# Patient Record
Sex: Female | Born: 1975 | Race: Black or African American | Hispanic: No | Marital: Married | Smoking: Current every day smoker
Health system: Southern US, Community
[De-identification: ages and names within clinical notes are randomized; demographics above are authoritative.]

## PROBLEM LIST (undated history)

## (undated) DIAGNOSIS — F32A Depression, unspecified: Secondary | ICD-10-CM

## (undated) DIAGNOSIS — Z5189 Encounter for other specified aftercare: Secondary | ICD-10-CM

## (undated) DIAGNOSIS — D649 Anemia, unspecified: Secondary | ICD-10-CM

## (undated) DIAGNOSIS — R011 Cardiac murmur, unspecified: Secondary | ICD-10-CM

## (undated) HISTORY — DX: Encounter for other specified aftercare: Z51.89

## (undated) HISTORY — PX: CHOLECYSTECTOMY: SHX55

## (undated) HISTORY — DX: Depression, unspecified: F32.A

## (undated) HISTORY — PX: TUBAL LIGATION: SHX77

## (undated) HISTORY — DX: Cardiac murmur, unspecified: R01.1

---

## 2021-01-24 ENCOUNTER — Emergency Department

## 2021-01-24 ENCOUNTER — Other Ambulatory Visit: Payer: Self-pay

## 2021-01-24 ENCOUNTER — Emergency Department
Admission: EM | Admit: 2021-01-24 | Discharge: 2021-01-24 | Disposition: A | Discharge status: Home / Self Care | Attending: Emergency Medicine | Admitting: Emergency Medicine

## 2021-01-24 DIAGNOSIS — R110 Nausea: Secondary | ICD-10-CM | POA: Insufficient documentation

## 2021-01-24 DIAGNOSIS — R1011 Right upper quadrant pain: Secondary | ICD-10-CM | POA: Insufficient documentation

## 2021-01-24 DIAGNOSIS — R1010 Upper abdominal pain, unspecified: Secondary | ICD-10-CM

## 2021-01-24 LAB — URINALYSIS, COMPLETE (UACMP) WITH MICROSCOPIC
Bacteria, UA: NONE SEEN
Bilirubin Urine: NEGATIVE
Glucose, UA: NEGATIVE mg/dL
Ketones, ur: NEGATIVE mg/dL
Leukocytes,Ua: NEGATIVE
Nitrite: NEGATIVE
Protein, ur: NEGATIVE mg/dL
Specific Gravity, Urine: 1.014 (ref 1.005–1.030)
pH: 6 (ref 5.0–8.0)

## 2021-01-24 LAB — COMPREHENSIVE METABOLIC PANEL
ALT: 10 U/L (ref 0–44)
AST: 14 U/L — ABNORMAL LOW (ref 15–41)
Albumin: 4.1 g/dL (ref 3.5–5.0)
Alkaline Phosphatase: 53 U/L (ref 38–126)
Anion gap: 7 (ref 5–15)
BUN: 7 mg/dL (ref 6–20)
CO2: 26 mmol/L (ref 22–32)
Calcium: 9.1 mg/dL (ref 8.9–10.3)
Chloride: 105 mmol/L (ref 98–111)
Creatinine, Ser: 0.61 mg/dL (ref 0.44–1.00)
GFR, Estimated: >60 mL/min (ref >60)
Glucose, Bld: 90 mg/dL (ref 70–99)
Potassium: 3.9 mmol/L (ref 3.5–5.1)
Sodium: 138 mmol/L (ref 135–145)
Total Bilirubin: 0.9 mg/dL (ref 0.3–1.2)
Total Protein: 7.3 g/dL (ref 6.5–8.1)

## 2021-01-24 LAB — CBC
HCT: 39.3 % (ref 36.0–46.0)
Hemoglobin: 13.3 g/dL (ref 12.0–15.0)
MCH: 30.3 pg (ref 26.0–34.0)
MCHC: 33.8 g/dL (ref 30.0–36.0)
MCV: 89.5 fL (ref 80.0–100.0)
Platelets: 138 10*3/uL — ABNORMAL LOW (ref 150–400)
RBC: 4.39 MIL/uL (ref 3.87–5.11)
RDW: 14.5 % (ref 11.5–15.5)
WBC: 9.9 10*3/uL (ref 4.0–10.5)
nRBC: 0 % (ref 0.0–0.2)

## 2021-01-24 LAB — POC URINE PREG, ED: Preg Test, Ur: NEGATIVE

## 2021-01-24 LAB — LIPASE, BLOOD: Lipase: 25 U/L (ref 11–51)

## 2021-01-24 MED ORDER — ACETAMINOPHEN 500 MG PO TABS
1000.0000 mg | ORAL_TABLET | Freq: Once | ORAL | Status: DC
  Filled 2021-01-24: qty 2

## 2021-01-24 MED ORDER — KETOROLAC TROMETHAMINE 30 MG/ML IJ SOLN
30.0000 mg | Freq: Once | INTRAMUSCULAR | Status: AC
  Administered 2021-01-24: 30 mg via INTRAMUSCULAR
  Filled 2021-01-24: qty 1

## 2021-01-24 MED ORDER — IBUPROFEN 400 MG PO TABS
600.0000 mg | ORAL_TABLET | Freq: Once | ORAL | Status: DC
  Filled 2021-01-24: qty 2

## 2021-01-24 NOTE — Discharge Instructions (Signed)
Please take Tylenol and ibuprofen/Advil for your pain.  It is safe to take them together, or to alternate them every few hours.  Take up to 1000mg  of Tylenol at a time, up to 4 times per day.  Do not take more than 4000 mg of Tylenol in 24 hours.  For ibuprofen, take 400-600 mg, 4-5 times per day.  I have attached the information for both general surgery and OB/GYN for you to follow-up with in the clinic.  If you develop any worsening symptoms despite these medications, please return to the ED.

## 2021-01-24 NOTE — ED Notes (Signed)
Pt c/o lower pelvic pain. Has tubes tied so MD wanted pt to come rule out ectopic pregnancy.

## 2021-01-24 NOTE — ED Provider Notes (Signed)
[facility] Emergency Department Provider Note   ____________________________________________    I have reviewed the triage vital signs and the nursing notes.   HISTORY  Chief Complaint Abdominal Pain     HPI Gina Jennings is a 45 y.o. female who presents with complaints of right flank pain for approximately 2 weeks.  Patient reports that she has had waxing and waning pain in her right flank, more in the back and in the flank.  Occasionally it radiates to her groin.  She denies dysuria.  No fevers or chills, occasional nausea.  Has not take anything for this.  No history of abdominal surgery.  Has not noticed any hematuria  History reviewed. No pertinent past medical history.  There are no problems to display for this patient.   History reviewed. No pertinent surgical history.  Prior to Admission medications   Not on File     Allergies Patient has no known allergies.  No family history on file.  Social History    Review of Systems  Constitutional: No fever/chills Eyes: No visual changes.  ENT: No sore throat. Cardiovascular: Denies chest pain. Respiratory: Denies shortness of breath. Gastrointestinal: As above Genitourinary: Negative for dysuria.  No hematuria Musculoskeletal: Negative for back pain. Skin: Negative for rash. Neurological: Negative for headache   ____________________________________________   PHYSICAL EXAM:  VITAL SIGNS: ED Triage Vitals  Enc Vitals Group     BP 01/24/21 0925 119/75     Pulse Rate 01/24/21 0925 75     Resp 01/24/21 0925 16     Temp 01/24/21 0925 98.4 F (36.9 C)     Temp Source 01/24/21 0925 Oral     SpO2 01/24/21 0925 100 %     Weight 01/24/21 0926 61.2 kg (135 lb)     Height 01/24/21 0926 1.575 m (5\' 2" )     Head Circumference --      Peak Flow --      Pain Score 01/24/21 0928 4     Pain Loc --      Pain Edu? --      Excl. in GC? --     Constitutional: Alert and  oriented  Nose: No congestion/rhinnorhea. Mouth/Throat: Mucous membranes are moist.    Cardiovascular: Normal rate, regular rhythm. Grossly normal heart sounds.  Good peripheral circulation. Respiratory: Normal respiratory effort.  No retractions. Lungs CTAB. Gastrointestinal: Soft, mild right upper quadrant tenderness palpation, no distention  Musculoskeletal: Warm and well perfused Neurologic:  Normal speech and language. No gross focal neurologic deficits are appreciated.  Skin:  Skin is warm, dry and intact. No rash noted. Psychiatric: Mood and affect are normal. Speech and behavior are normal.  ____________________________________________   LABS (all labs ordered are listed, but only abnormal results are displayed)  Labs Reviewed  COMPREHENSIVE METABOLIC PANEL - Abnormal; Notable for the following components:      Result Value   AST 14 (*)    All other components within normal limits  CBC - Abnormal; Notable for the following components:   Platelets 138 (*)    All other components within normal limits  URINALYSIS, COMPLETE (UACMP) WITH MICROSCOPIC - Abnormal; Notable for the following components:   Color, Urine YELLOW (*)    APPearance HAZY (*)    Hgb urine dipstick MODERATE (*)    All other components within normal limits  LIPASE, BLOOD  POC URINE PREG, ED   ____________________________________________  EKG  None ____________________________________________  RADIOLOGY  CT renal  study reviewed by me Ultrasound reviewed by me ____________________________________________   PROCEDURES  Procedure(s) performed: No  Procedures   Critical Care performed: No ____________________________________________   INITIAL IMPRESSION / ASSESSMENT AND PLAN / ED COURSE  Pertinent labs & imaging results that were available during my care of the patient were reviewed by me and considered in my medical decision making (see chart for details).  Patient presents with right  flank pain, she describes more pain in the back and the flank, she does have hemoglobin in her urine raising suspicion for ureterolithiasis, differential also includes cholelithiasis  Her pain is under control at this time, will send for CT renal stone study  CT negative for ureterolithiasis, right ovarian cyst unlikely to be causing her pain given location of her pain more of the right mid back.  Abnormal appearance of the gallbladder on CT, will send for ultrasound  I have asked my colleague to follow-up on ultrasound results    ____________________________________________   FINAL CLINICAL IMPRESSION(S) / ED DIAGNOSES  Final diagnoses:  Upper abdominal pain        Note:  This document was prepared using Dragon voice recognition software and may include unintentional dictation errors.   Jene Every, MD 01/24/21 [phone]

## 2021-01-24 NOTE — ED Triage Notes (Signed)
Pt c/o RLQ pain that radiates into the leg and around into the back with nausea for the past 2 weeks.

## 2021-01-24 NOTE — ED Notes (Signed)
Pt asked to step outside for cigarette. Informed that if she leaves she will have to check back in. Pt offered nicotine patch but declined.

## 2021-01-24 NOTE — ED Notes (Signed)
Pt requesting discharge. Dr. Katrinka Blazing at bedside

## 2021-01-24 NOTE — ED Notes (Signed)
Pt states that she cannot keep the pills down without food. Will wait for report from surgeon that pt can eat before taking pills.

## 2021-01-24 NOTE — ED Notes (Signed)
Taken to CT.

## 2021-01-24 NOTE — ED Notes (Signed)
Secretary called to notified nuc med on call tech

## 2021-01-24 NOTE — ED Provider Notes (Signed)
Patient received in signout from Dr. Cyril Loosen pending RUQ u/s to evaluate for acute cystitis due to a CT renal study demonstrating an enlarged gallbladder with stones.  RUQ ultrasound reviewed by me with stones and sludge, borderline wall thickening, but no sonographic Murphy's sign.  I reevaluate the patient and she has some tenderness to her RUQ, RLQ and really diffusely and mildly throughout her right-sided abdomen.  I discussed the case with Dr. Maia Plan, who recommends HIDA scan, but apparently we do not have nuclear medicine coverage overnight this week due to staffing shortages.  I have paged Dr. Maia Plan to discuss the case with him again, but prior to this callback patient is requesting and demanding discharge.  She looks well to me and has minimal abdominal tenderness without peritoneal features throughout her right-sided abdomen.  We again discussed the possibility of ovarian pathology versus biliary pathology causing her symptoms.  She requests referrals to outpatient OB/GYN and surgery as well as discharge.  She is capacity make this decision and we discussed return precautions for the ED.  Patient discharged in no acute distress.   Clinical Course as of 01/24/21 1924  Tue Jan 24, 2021  1518 I speak with Dr Maia Plan, who will evaluate the patient [DS]  1637 I speak with Dr Maia Plan, he has evaluated pt and orders HIDA scan. He recommends that if this scan is negative for cholecystitis, he would be comfortable with discharging the patient and her following up as an outpatient with surgery for eventual cholecystectomy as an outpatient. [DS]  1845 Nurses and unit secretary inform me that HIDA scan is unavailable during the evenings this week due to staffing shortages.  I was unaware of this and I paged Dr. Maia Plan to discuss the case with him. [DS]  1921 Called to the bedside because patient is requesting discharge.  I reevaluate the patient and she reports that she is hungry and wants to go home.  She  is requesting outpatient referrals to surgery and OB/GYN and she is refusing additional analgesia. [DS]    Clinical Course User Index [DS] Delton Prairie, MD      Delton Prairie, MD 01/24/21 [phone]

## 2021-01-24 NOTE — ED Notes (Signed)
Surgery at bedside.

## 2021-01-25 ENCOUNTER — Ambulatory Visit: Payer: Self-pay

## 2021-02-02 ENCOUNTER — Ambulatory Visit: Payer: Self-pay | Admitting: General Surgery

## 2021-02-03 ENCOUNTER — Ambulatory Visit: Payer: Self-pay | Admitting: General Surgery

## 2021-02-03 ENCOUNTER — Other Ambulatory Visit: Payer: Self-pay

## 2021-02-03 ENCOUNTER — Other Ambulatory Visit
Admission: RE | Admit: 2021-02-03 | Discharge: 2021-02-03 | Disposition: A | Discharge status: Home / Self Care | Source: Ambulatory Visit | Attending: General Surgery | Admitting: General Surgery

## 2021-02-03 DIAGNOSIS — Z01812 Encounter for preprocedural laboratory examination: Secondary | ICD-10-CM | POA: Diagnosis not present

## 2021-02-03 DIAGNOSIS — Z20822 Contact with and (suspected) exposure to covid-19: Secondary | ICD-10-CM | POA: Insufficient documentation

## 2021-02-03 HISTORY — DX: Anemia, unspecified: D64.9

## 2021-02-03 LAB — SARS CORONAVIRUS 2 (TAT 6-24 HRS): SARS Coronavirus 2: NEGATIVE

## 2021-02-03 NOTE — H&P (Signed)
PATIENT PROFILE: Gina Jennings is a 45 y.o. female who presents to the Clinic for consultation at the request of Dr. Room for evaluation of cholelithiasis.  PCP:  Gina Boroughs, MD  HISTORY OF PRESENT ILLNESS: Ms. Gina Jennings reports she went to the emergency room for evaluation of right-sided abdominal pain.  Initially she reports that the abdominal pain was lower in the abdomen but the last week he has been in the right upper quadrant.  The pain radiates to her right shoulder.  The pain exacerbated by oral intake of greasy food.  There has been no alleviating factors.  Pain is intermittent.  Patient denies fever or chills.  At the ED she was found without leukocytosis.  Ultrasound showed gallstones and borderline gallbladder wall thickening.  I personally evaluated the images of the abdominal ultrasound.  She also had a CT scan of the abdomen and pelvis that shows a complex cyst of the right ovary.  The patient reported that she has been evaluated by gynecology for this cyst and they reported that there is no need of excision of the cyst.   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   Urge incontinence 03/12/2012   Anemia 03/12/2012   Depression 03/12/2012   Vitamin D deficiency 03/12/2012      GENERAL REVIEW OF SYSTEMS:   General ROS: negative for - chills, fatigue, fever, weight gain or weight loss Allergy and Immunology ROS: negative for - hives  Hematological and Lymphatic ROS: negative for - bleeding problems or bruising, negative for palpable nodes Endocrine ROS: negative for - heat or cold intolerance, hair changes Respiratory ROS: negative for - cough, shortness of breath or wheezing Cardiovascular ROS: no chest pain or palpitations GI ROS: negative for nausea, vomiting, diarrhea, constipation.  Positive abdominal pain Musculoskeletal ROS: negative for - joint swelling or muscle  pain Neurological ROS: negative for - confusion, syncope Dermatological ROS: negative for pruritus and rash Psychiatric: negative for anxiety, depression, difficulty sleeping and memory loss  MEDICATIONS: Current Outpatient Medications  Medication Sig Dispense Refill  . ergocalciferol, vitamin D2, 1,250 mcg (50,000 unit) capsule Take 1 capsule (50,000 Units total) by mouth every 7 (seven) days 12 capsule 3  . valACYclovir (VALTREX) 500 MG tablet Take 1 tablet (500 mg total) by mouth once daily as needed 90 tablet 3  . norethindrone-ethinyl estradiol (BLISOVI FE 1/20, 28,) 1 mg-20 mcg (21)/75 mg (7) tablet Take 1 tablet by mouth once daily for 90 days 84 tablet 3   No current facility-administered medications for this visit.    ALLERGIES: Patient has no known allergies.  PAST MEDICAL HISTORY: Past Medical History:  Diagnosis Date  . Anemia 03/12/2012  . Depression 03/12/2012  . Encounter for blood transfusion 09/2006  . 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 Sleep apnea   . Smoker 03/12/2012  . Urge incontinence 03/12/2012    PAST SURGICAL HISTORY: Past Surgical History:  Procedure Laterality Date  . CESAREAN SECTION  2011  . WISDOM TOOTH EXTRACTION       FAMILY HISTORY: Family History  Problem Relation Age of Onset  . Hepatitis Mother   . Cirrhosis Mother   . Hepatitis C Mother   . Stroke Father   . No Known Problems Sister      SOCIAL HISTORY: Social History   Socioeconomic History  . Marital status: Married  Spouse name: Not on file  . Number of children: Not on file  . Years of education: Not on file  . Highest education level: Not on file  Occupational History  . Not on file  Tobacco Use  . Smoking status: Current Every Day Smoker    Packs/day: 0.50    Years: 18.00    Pack years: 9.00    Types: Cigarettes    Last attempt to quit: 02/16/2014    Years since quitting: 6.9  . Smokeless tobacco: Never Used  Substance and Sexual Activity  .  Alcohol use: Yes    Comment: socially  . Drug use: No  . Sexual activity: Yes    Partners: Female, Female    Birth control/protection: Surgical  Other Topics Concern  . Would you please tell us about the people who live in your home, your pets, or anything else important to your social life? Yes  Social History Narrative  . Not on file   Social Determinants of Health   Financial Resource Strain: Not on file  Food Insecurity: Not on file  Transportation Needs: Not on file    PHYSICAL EXAM: Vitals:   02/02/21 1330  BP: 109/63  Pulse: 71   Body mass index is 24.87 kg/m. Weight: 61.7 kg (136 lb)   GENERAL: Alert, active, oriented x3  HEENT: Pupils equal reactive to light. Extraocular movements are intact. Sclera clear. Palpebral conjunctiva normal red color.Pharynx clear.  NECK: Supple with no palpable mass and no adenopathy.  LUNGS: Sound clear with no rales rhonchi or wheezes.  HEART: Regular rhythm S1 and S2 without murmur.  ABDOMEN: Soft and depressible, nontender with no palpable mass, no hepatomegaly.   EXTREMITIES: Well-developed well-nourished symmetrical with no dependent edema.  NEUROLOGICAL: Awake alert oriented, facial expression symmetrical, moving all extremities.  REVIEW OF DATA: I have reviewed the following data today: No visits with results within 3 Month(s) from this visit.  Latest known visit with results is:  Office Visit on 06/02/2020  Component Date Value  . Vitamin D Total, 25OH 06/02/2020 30   . WBC (White Blood Cell Co* 06/02/2020 8.2   . Hemoglobin 06/02/2020 13.3   . Hematocrit 06/02/2020 41.2   . Platelets 06/02/2020 191   . MCV (Mean Corpuscular Vo* 06/02/2020 91   . MCH (Mean Corpuscular He* 06/02/2020 29.4   . MCHC (Mean Corpuscular H* 06/02/2020 32.3   . RBC (Red Blood Cell Coun* 06/02/2020 4.53   . RDW-CV (Red Cell Distrib* 06/02/2020 14.0   . MPV (Mean Platelet Volum* 06/02/2020 11.2   . Neutrophil Count 06/02/2020 5.4   .  Neutrophil % 06/02/2020 66.0   . Lymphocyte Count 06/02/2020 2.2   . Lymphocyte % 06/02/2020 26.6   . Monocyte Count 06/02/2020 0.5   . Monocyte % 06/02/2020 6.2   . Eosinophil Count 06/02/2020 0.04   . Eosinophil % 06/02/2020 0.5   . Basophil Count 06/02/2020 0.05   . Basophil % 06/02/2020 0.6   . Sodium 06/02/2020 140   . Potassium 06/02/2020 3.9   . Chloride 06/02/2020 103   . Carbon Dioxide (CO2) 06/02/2020 27   . Urea Nitrogen (BUN) 06/02/2020 4 (!)  . Creatinine 06/02/2020 1.0   . Glucose 06/02/2020 81   . Calcium 06/02/2020 9.4   . AST (Aspartate Aminotran* 06/02/2020 24   . ALT (Alanine Aminotransf* 06/02/2020 20   . Bilirubin, Total 06/02/2020 0.4   . Alk Phos (Alkaline Phosp* 06/02/2020 82   . Albumin 06/02/2020 3.9   .  Protein, Total 06/02/2020 7.5   . Anion Gap 06/02/2020 10   . BUN/CREA Ratio 06/02/2020 4 (!)  . Glomerular Filtration Ra* 06/02/2020 69   . Hepatitis B Surface Anti* 06/02/2020 Reactive (!)  . Hepatitis B Surface Anti* 06/02/2020 123      ASSESSMENT: Ms. Panchal is a 45 y.o. female presenting for consultation for cholelithiasis.    Patient was oriented about the diagnosis of cholelithiasis. Also oriented about what is the gallbladder, its anatomy and function and the implications of having stones. The patient was oriented about the treatment alternatives (observation vs cholecystectomy). Patient was oriented that a low percentage of patient will continue to have similar pain symptoms even after the gallbladder is removed. Surgical technique (open vs laparoscopic) was discussed. It was also discussed the goals of the surgery (decrease the pain episodes and avoid the risk of cholecystitis) and the risk of surgery including: bleeding, infection, common bile duct injury, stone retention, injury to other organs such as bowel, liver, stomach, other complications such as hernia, bowel obstruction among others. Also discussed with patient about anesthesia and its  complications such as: reaction to medications, pneumonia, heart complications, death, among others.   I considered that this patient has 2 types of pains.  The pain that she described in the right upper quadrant that radiates to her right shoulder and exacerbated by greasy food is coming from biliary colic.  The other pain in the right lower abdomen that radiates to her right lower extremity and the thing that is associated with her gallbladder.  I was very clear with the patient that she may continue with the lower abdominal pain but the goal of the surgery is to improve her right upper quadrant pain that radiates to her right shoulder and exacerbated by greasy food.  She reports she understood and agreed to proceed with surgery.  Cholelithiasis without cholecystitis [K80.20]  PLAN: 1.  Robotic assisted laparoscopic cholecystectomy (29924) 2.  CBC, CMP (done) 3.  Do not take aspirin 5 days before the procedure 4.  Contact us if has any question or concern.  Patient and her husband verbalized understanding, all questions were answered, and were agreeable with the plan outlined above.   Herbert Pun, MD  Electronically signed by Herbert Pun, MD

## 2021-02-03 NOTE — Patient Instructions (Signed)
Your procedure is scheduled on:02-06-21 MONDAY Report to the Registration Desk on the 1st floor of the [facility]. To find out your arrival time, please call [phone] between 1PM - 3PM on:02-03-21 FRIDAY  REMEMBER: Instructions that are not followed completely may result in serious medical risk, up to and including death; or upon the discretion of your surgeon and anesthesiologist your surgery may need to be rescheduled.  Do not eat food after midnight the night before surgery.  No gum chewing, lozengers or hard candies.  You may however, drink CLEAR liquids up to 2 hours before you are scheduled to arrive for your surgery. Do not drink anything within 2 hours of your scheduled arrival time.  Clear liquids include: - water  - apple juice without pulp - gatorade  - black coffee or tea (Do NOT add milk or creamers to the coffee or tea) Do NOT drink anything that is not on this list.  DO NOT TAKE ANY MEDICATION THE MORNING OF SURGERY  One week prior to surgery: Stop Anti-inflammatories (NSAIDS) such as Advil, Aleve, Ibuprofen, Motrin, Naproxen, Naprosyn and Aspirin based products such as Excedrin, Goodys Powder, BC Powder-OK TO TAKE TYLENOL IF NEEDED  Stop ANY OVER THE COUNTER supplements until after surgery-OK TO CONTINUE YOUR VITAMIN D2  No Alcohol for 24 hours before or after surgery.  No Smoking including e-cigarettes for 24 hours prior to surgery.  No chewable tobacco products for at least 6 hours prior to surgery.  No nicotine patches on the day of surgery.  Do not use any "recreational" drugs for at least a week prior to your surgery.  Please be advised that the combination of cocaine and anesthesia may have negative outcomes, up to and including death. If you test positive for cocaine, your surgery will be cancelled.  On the morning of surgery brush your teeth with toothpaste and water, you may rinse your mouth with mouthwash if you wish. Do not swallow any toothpaste  or mouthwash.  Do not wear jewelry, make-up, hairpins, clips or nail polish.  Do not wear lotions, powders, or perfumes.   Do not shave body from the neck down 48 hours prior to surgery just in case you cut yourself which could leave a site for infection.  Also, freshly shaved skin may become irritated if using the CHG soap.  Contact lenses, hearing aids and dentures may not be worn into surgery.  Do not bring valuables to the hospital. [facility] is not responsible for any missing/lost belongings or valuables.   Notify your doctor if there is any change in your medical condition (cold, fever, infection).  Wear comfortable clothing (specific to your surgery type) to the hospital.  Plan for stool softeners for home use; pain medications have a tendency to cause constipation. You can also help prevent constipation by eating foods high in fiber such as fruits and vegetables and drinking plenty of fluids as your diet allows.  After surgery, you can help prevent lung complications by doing breathing exercises.  Take deep breaths and cough every 1-2 hours. Your doctor may order a device called an Incentive Spirometer to help you take deep breaths. When coughing or sneezing, hold a pillow firmly against your incision with both hands. This is called "splinting." Doing this helps protect your incision. It also decreases belly discomfort.  If you are being admitted to the hospital overnight, leave your suitcase in the car. After surgery it may be brought to your room.  If you are  being discharged the day of surgery, you will not be allowed to drive home. You will need a responsible adult (18 years or older) to drive you home and stay with you that night.   If you are taking public transportation, you will need to have a responsible adult (18 years or older) with you. Please confirm with your physician that it is acceptable to use public transportation.   Please call the [facility]  Dept. at [phone] if you have any questions about these instructions.  Surgery Visitation Policy:  Patients undergoing a surgery or procedure may have one family member or support person with them as long as that person is not COVID-19 positive or experiencing its symptoms.  That person may remain in the waiting area during the procedure.  Inpatient Visitation:    Visiting hours are 7 a.m. to 8 p.m. Inpatients will be allowed two visitors daily. The visitors may change each day during the patient's stay. No visitors under the age of 54. Any visitor under the age of 42 must be accompanied by an adult. The visitor must pass COVID-19 screenings, use hand sanitizer when entering and exiting the patient's room and wear a mask at all times, including in the patient's room. Patients must also wear a mask when staff or their visitor are in the room. Masking is required regardless of vaccination status.

## 2021-02-03 NOTE — H&P (View-Only) (Signed)
PATIENT PROFILE: Gina Jennings is a 45 y.o. female who presents to the Clinic for consultation at the request of Dr. Room for evaluation of cholelithiasis.  PCP:  Jennye Boroughs, MD  HISTORY OF PRESENT ILLNESS: Gina Jennings reports she went to the emergency room for evaluation of right-sided abdominal pain.  Initially she reports that the abdominal pain was lower in the abdomen but the last week he has been in the right upper quadrant.  The pain radiates to her right shoulder.  The pain exacerbated by oral intake of greasy food.  There has been no alleviating factors.  Pain is intermittent.  Patient denies fever or chills.  At the ED she was found without leukocytosis.  Ultrasound showed gallstones and borderline gallbladder wall thickening.  I personally evaluated the images of the abdominal ultrasound.  She also had a CT scan of the abdomen and pelvis that shows a complex cyst of the right ovary.  The patient reported that she has been evaluated by gynecology for this cyst and they reported that there is no need of excision of the cyst.   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   Urge incontinence 03/12/2012   Anemia 03/12/2012   Depression 03/12/2012   Vitamin D deficiency 03/12/2012      GENERAL REVIEW OF SYSTEMS:   General ROS: negative for - chills, fatigue, fever, weight gain or weight loss Allergy and Immunology ROS: negative for - hives  Hematological and Lymphatic ROS: negative for - bleeding problems or bruising, negative for palpable nodes Endocrine ROS: negative for - heat or cold intolerance, hair changes Respiratory ROS: negative for - cough, shortness of breath or wheezing Cardiovascular ROS: no chest pain or palpitations GI ROS: negative for nausea, vomiting, diarrhea, constipation.  Positive abdominal pain Musculoskeletal ROS: negative for - joint swelling or muscle  pain Neurological ROS: negative for - confusion, syncope Dermatological ROS: negative for pruritus and rash Psychiatric: negative for anxiety, depression, difficulty sleeping and memory loss  MEDICATIONS: Current Outpatient Medications  Medication Sig Dispense Refill  . ergocalciferol, vitamin D2, 1,250 mcg (50,000 unit) capsule Take 1 capsule (50,000 Units total) by mouth every 7 (seven) days 12 capsule 3  . valACYclovir (VALTREX) 500 MG tablet Take 1 tablet (500 mg total) by mouth once daily as needed 90 tablet 3  . norethindrone-ethinyl estradiol (BLISOVI FE 1/20, 28,) 1 mg-20 mcg (21)/75 mg (7) tablet Take 1 tablet by mouth once daily for 90 days 84 tablet 3   No current facility-administered medications for this visit.    ALLERGIES: Patient has no known allergies.  PAST MEDICAL HISTORY: Past Medical History:  Diagnosis Date  . Anemia 03/12/2012  . Depression 03/12/2012  . Encounter for blood transfusion 09/2006  . 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 Sleep apnea   . Smoker 03/12/2012  . Urge incontinence 03/12/2012    PAST SURGICAL HISTORY: Past Surgical History:  Procedure Laterality Date  . CESAREAN SECTION  2011  . WISDOM TOOTH EXTRACTION       FAMILY HISTORY: Family History  Problem Relation Age of Onset  . Hepatitis Mother   . Cirrhosis Mother   . Hepatitis C Mother   . Stroke Father   . No Known Problems Sister      SOCIAL HISTORY: Social History   Socioeconomic History  . Marital status: Married  Spouse name: Not on file  . Number of children: Not on file  . Years of education: Not on file  . Highest education level: Not on file  Occupational History  . Not on file  Tobacco Use  . Smoking status: Current Every Day Smoker    Packs/day: 0.50    Years: 18.00    Pack years: 9.00    Types: Cigarettes    Last attempt to quit: 02/16/2014    Years since quitting: 6.9  . Smokeless tobacco: Never Used  Substance and Sexual Activity  .  Alcohol use: Yes    Comment: socially  . Drug use: No  . Sexual activity: Yes    Partners: Female, Female    Birth control/protection: Surgical  Other Topics Concern  . Would you please tell us about the people who live in your home, your pets, or anything else important to your social life? Yes  Social History Narrative  . Not on file   Social Determinants of Health   Financial Resource Strain: Not on file  Food Insecurity: Not on file  Transportation Needs: Not on file    PHYSICAL EXAM: Vitals:   02/02/21 1330  BP: 109/63  Pulse: 71   Body mass index is 24.87 kg/m. Weight: 61.7 kg (136 lb)   GENERAL: Alert, active, oriented x3  HEENT: Pupils equal reactive to light. Extraocular movements are intact. Sclera clear. Palpebral conjunctiva normal red color.Pharynx clear.  NECK: Supple with no palpable mass and no adenopathy.  LUNGS: Sound clear with no rales rhonchi or wheezes.  HEART: Regular rhythm S1 and S2 without murmur.  ABDOMEN: Soft and depressible, nontender with no palpable mass, no hepatomegaly.   EXTREMITIES: Well-developed well-nourished symmetrical with no dependent edema.  NEUROLOGICAL: Awake alert oriented, facial expression symmetrical, moving all extremities.  REVIEW OF DATA: I have reviewed the following data today: No visits with results within 3 Month(s) from this visit.  Latest known visit with results is:  Office Visit on 06/02/2020  Component Date Value  . Vitamin D Total, 25OH 06/02/2020 30   . WBC (White Blood Cell Co* 06/02/2020 8.2   . Hemoglobin 06/02/2020 13.3   . Hematocrit 06/02/2020 41.2   . Platelets 06/02/2020 191   . MCV (Mean Corpuscular Vo* 06/02/2020 91   . MCH (Mean Corpuscular He* 06/02/2020 29.4   . MCHC (Mean Corpuscular H* 06/02/2020 32.3   . RBC (Red Blood Cell Coun* 06/02/2020 4.53   . RDW-CV (Red Cell Distrib* 06/02/2020 14.0   . MPV (Mean Platelet Volum* 06/02/2020 11.2   . Neutrophil Count 06/02/2020 5.4   .  Neutrophil % 06/02/2020 66.0   . Lymphocyte Count 06/02/2020 2.2   . Lymphocyte % 06/02/2020 26.6   . Monocyte Count 06/02/2020 0.5   . Monocyte % 06/02/2020 6.2   . Eosinophil Count 06/02/2020 0.04   . Eosinophil % 06/02/2020 0.5   . Basophil Count 06/02/2020 0.05   . Basophil % 06/02/2020 0.6   . Sodium 06/02/2020 140   . Potassium 06/02/2020 3.9   . Chloride 06/02/2020 103   . Carbon Dioxide (CO2) 06/02/2020 27   . Urea Nitrogen (BUN) 06/02/2020 4 (!)  . Creatinine 06/02/2020 1.0   . Glucose 06/02/2020 81   . Calcium 06/02/2020 9.4   . AST (Aspartate Aminotran* 06/02/2020 24   . ALT (Alanine Aminotransf* 06/02/2020 20   . Bilirubin, Total 06/02/2020 0.4   . Alk Phos (Alkaline Phosp* 06/02/2020 82   . Albumin 06/02/2020 3.9   .  Protein, Total 06/02/2020 7.5   . Anion Gap 06/02/2020 10   . BUN/CREA Ratio 06/02/2020 4 (!)  . Glomerular Filtration Ra* 06/02/2020 69   . Hepatitis B Surface Anti* 06/02/2020 Reactive (!)  . Hepatitis B Surface Anti* 06/02/2020 123      ASSESSMENT: Gina Jennings is a 45 y.o. female presenting for consultation for cholelithiasis.    Patient was oriented about the diagnosis of cholelithiasis. Also oriented about what is the gallbladder, its anatomy and function and the implications of having stones. The patient was oriented about the treatment alternatives (observation vs cholecystectomy). Patient was oriented that a low percentage of patient Jennings continue to have similar pain symptoms even after the gallbladder is removed. Surgical technique (open vs laparoscopic) was discussed. It was also discussed the goals of the surgery (decrease the pain episodes and avoid the risk of cholecystitis) and the risk of surgery including: bleeding, infection, common bile duct injury, stone retention, injury to other organs such as bowel, liver, stomach, other complications such as hernia, bowel obstruction among others. Also discussed with patient about anesthesia and its  complications such as: reaction to medications, pneumonia, heart complications, death, among others.   I considered that this patient has 2 types of pains.  The pain that she described in the right upper quadrant that radiates to her right shoulder and exacerbated by greasy food is coming from biliary colic.  The other pain in the right lower abdomen that radiates to her right lower extremity and the thing that is associated with her gallbladder.  I was very clear with the patient that she may continue with the lower abdominal pain but the goal of the surgery is to improve her right upper quadrant pain that radiates to her right shoulder and exacerbated by greasy food.  She reports she understood and agreed to proceed with surgery.  Cholelithiasis without cholecystitis [K80.20]  PLAN: 1.  Robotic assisted laparoscopic cholecystectomy (67893) 2.  CBC, CMP (done) 3.  Do not take aspirin 5 days before the procedure 4.  Contact us if has any question or concern.  Patient and her husband verbalized understanding, all questions were answered, and were agreeable with the plan outlined above.   Herbert Pun, MD  Electronically signed by Herbert Pun, MD

## 2021-02-05 MED ORDER — LACTATED RINGERS IV SOLN: INTRAVENOUS | Status: DC

## 2021-02-05 MED ORDER — ORAL CARE MOUTH RINSE: 15.0000 mL | Freq: Once | OROMUCOSAL | Status: AC

## 2021-02-05 MED ORDER — CEFAZOLIN SODIUM-DEXTROSE 2-4 GM/100ML-% IV SOLN
2.0000 g | INTRAVENOUS | Status: AC
  Administered 2021-02-06: 2 g via INTRAVENOUS

## 2021-02-05 MED ORDER — INDOCYANINE GREEN 25 MG IV SOLR
1.2500 mg | Freq: Once | INTRAVENOUS | Status: AC
  Administered 2021-02-06: 1.25 mg via INTRAVENOUS
  Filled 2021-02-05: qty 0.5

## 2021-02-05 MED ORDER — CHLORHEXIDINE GLUCONATE 0.12 % MT SOLN: 15.0000 mL | Freq: Once | OROMUCOSAL | Status: AC

## 2021-02-05 MED ORDER — FAMOTIDINE 20 MG PO TABS: 20.0000 mg | ORAL_TABLET | Freq: Once | ORAL | Status: AC

## 2021-02-06 ENCOUNTER — Encounter: Payer: Self-pay | Admitting: General Surgery

## 2021-02-06 ENCOUNTER — Encounter
Admission: RE | Disposition: A | Discharge status: Home / Self Care | Payer: Self-pay | Source: Home / Self Care | Attending: General Surgery

## 2021-02-06 ENCOUNTER — Ambulatory Visit
Admission: RE | Admit: 2021-02-06 | Discharge: 2021-02-06 | Disposition: A | Discharge status: Home / Self Care | Attending: General Surgery | Admitting: General Surgery

## 2021-02-06 ENCOUNTER — Other Ambulatory Visit: Payer: Self-pay

## 2021-02-06 ENCOUNTER — Ambulatory Visit: Admitting: Anesthesiology

## 2021-02-06 DIAGNOSIS — K8064 Calculus of gallbladder and bile duct with chronic cholecystitis without obstruction: Secondary | ICD-10-CM | POA: Diagnosis present

## 2021-02-06 DIAGNOSIS — Z79899 Other long term (current) drug therapy: Secondary | ICD-10-CM | POA: Insufficient documentation

## 2021-02-06 DIAGNOSIS — K828 Other specified diseases of gallbladder: Secondary | ICD-10-CM | POA: Insufficient documentation

## 2021-02-06 DIAGNOSIS — K8010 Calculus of gallbladder with chronic cholecystitis without obstruction: Secondary | ICD-10-CM | POA: Insufficient documentation

## 2021-02-06 DIAGNOSIS — F17210 Nicotine dependence, cigarettes, uncomplicated: Secondary | ICD-10-CM | POA: Diagnosis not present

## 2021-02-06 LAB — POCT PREGNANCY, URINE: Preg Test, Ur: NEGATIVE

## 2021-02-06 SURGERY — CHOLECYSTECTOMY, ROBOT-ASSISTED, LAPAROSCOPIC
Anesthesia: General | Site: Abdomen

## 2021-02-06 MED ORDER — ONDANSETRON HCL 4 MG/2ML IJ SOLN: 4.0000 mg | Freq: Once | INTRAMUSCULAR | Status: DC | PRN

## 2021-02-06 MED ORDER — ROCURONIUM BROMIDE 100 MG/10ML IV SOLN
INTRAVENOUS | Status: DC | PRN
  Administered 2021-02-06: 35 mg via INTRAVENOUS
  Administered 2021-02-06: 15 mg via INTRAVENOUS

## 2021-02-06 MED ORDER — FAMOTIDINE 20 MG PO TABS
ORAL_TABLET | ORAL | Status: AC
  Administered 2021-02-06: 20 mg via ORAL
  Filled 2021-02-06: qty 1

## 2021-02-06 MED ORDER — CHLORHEXIDINE GLUCONATE 0.12 % MT SOLN
OROMUCOSAL | Status: AC
  Administered 2021-02-06: 15 mL via OROMUCOSAL
  Filled 2021-02-06: qty 15

## 2021-02-06 MED ORDER — CEFAZOLIN SODIUM-DEXTROSE 2-4 GM/100ML-% IV SOLN
INTRAVENOUS | Status: AC
  Filled 2021-02-06: qty 100

## 2021-02-06 MED ORDER — HYDROMORPHONE HCL 1 MG/ML IJ SOLN
INTRAMUSCULAR | Status: AC
  Filled 2021-02-06: qty 1

## 2021-02-06 MED ORDER — MIDAZOLAM HCL 2 MG/2ML IJ SOLN
INTRAMUSCULAR | Status: AC
  Filled 2021-02-06: qty 2

## 2021-02-06 MED ORDER — HYDROCODONE-ACETAMINOPHEN 5-325 MG PO TABS
ORAL_TABLET | ORAL | Status: AC
  Filled 2021-02-06: qty 1

## 2021-02-06 MED ORDER — BUPIVACAINE-EPINEPHRINE (PF) 0.25% -1:200000 IJ SOLN
INTRAMUSCULAR | Status: AC
  Filled 2021-02-06: qty 30

## 2021-02-06 MED ORDER — FENTANYL CITRATE (PF) 100 MCG/2ML IJ SOLN
INTRAMUSCULAR | Status: AC
  Filled 2021-02-06: qty 2

## 2021-02-06 MED ORDER — EPHEDRINE 5 MG/ML INJ
INTRAVENOUS | Status: AC
  Filled 2021-02-06: qty 10

## 2021-02-06 MED ORDER — FENTANYL CITRATE (PF) 100 MCG/2ML IJ SOLN
INTRAMUSCULAR | Status: DC | PRN
  Administered 2021-02-06 (×2): 50 ug via INTRAVENOUS

## 2021-02-06 MED ORDER — LIDOCAINE HCL (PF) 2 % IJ SOLN
INTRAMUSCULAR | Status: AC
  Filled 2021-02-06: qty 5

## 2021-02-06 MED ORDER — LIDOCAINE HCL (CARDIAC) PF 100 MG/5ML IV SOSY
PREFILLED_SYRINGE | INTRAVENOUS | Status: DC | PRN
  Administered 2021-02-06: 100 mg via INTRAVENOUS

## 2021-02-06 MED ORDER — SUGAMMADEX SODIUM 200 MG/2ML IV SOLN
INTRAVENOUS | Status: DC | PRN
  Administered 2021-02-06: 200 mg via INTRAVENOUS

## 2021-02-06 MED ORDER — MIDAZOLAM HCL 2 MG/2ML IJ SOLN
INTRAMUSCULAR | Status: DC | PRN
  Administered 2021-02-06: 2 mg via INTRAVENOUS

## 2021-02-06 MED ORDER — DEXAMETHASONE SODIUM PHOSPHATE 10 MG/ML IJ SOLN
INTRAMUSCULAR | Status: DC | PRN
  Administered 2021-02-06: 5 mg via INTRAVENOUS

## 2021-02-06 MED ORDER — ONDANSETRON HCL 4 MG/2ML IJ SOLN
INTRAMUSCULAR | Status: AC
  Filled 2021-02-06: qty 2

## 2021-02-06 MED ORDER — PROPOFOL 10 MG/ML IV BOLUS
INTRAVENOUS | Status: AC
  Filled 2021-02-06: qty 20

## 2021-02-06 MED ORDER — PROPOFOL 10 MG/ML IV BOLUS
INTRAVENOUS | Status: DC | PRN
  Administered 2021-02-06: 30 mg via INTRAVENOUS
  Administered 2021-02-06: 150 mg via INTRAVENOUS

## 2021-02-06 MED ORDER — FENTANYL CITRATE (PF) 100 MCG/2ML IJ SOLN
25.0000 ug | INTRAMUSCULAR | Status: AC | PRN
  Administered 2021-02-06 (×8): 25 ug via INTRAVENOUS

## 2021-02-06 MED ORDER — SUCCINYLCHOLINE CHLORIDE 200 MG/10ML IV SOSY
PREFILLED_SYRINGE | INTRAVENOUS | Status: AC
  Filled 2021-02-06: qty 10

## 2021-02-06 MED ORDER — HYDROCODONE-ACETAMINOPHEN 5-325 MG PO TABS
1.0000 | ORAL_TABLET | Freq: Once | ORAL | Status: AC
  Administered 2021-02-06: 1 via ORAL

## 2021-02-06 MED ORDER — DEXAMETHASONE SODIUM PHOSPHATE 10 MG/ML IJ SOLN
INTRAMUSCULAR | Status: AC
  Filled 2021-02-06: qty 1

## 2021-02-06 MED ORDER — HYDROMORPHONE HCL 1 MG/ML IJ SOLN
0.5000 mg | INTRAMUSCULAR | Status: DC | PRN
  Administered 2021-02-06: 0.5 mg via INTRAVENOUS

## 2021-02-06 MED ORDER — HYDROCODONE-ACETAMINOPHEN 5-325 MG PO TABS: 1.0000 | ORAL_TABLET | ORAL | 0 refills | Status: AC | PRN

## 2021-02-06 MED ORDER — ONDANSETRON HCL 4 MG/2ML IJ SOLN
INTRAMUSCULAR | Status: DC | PRN
  Administered 2021-02-06: 4 mg via INTRAVENOUS

## 2021-02-06 MED ORDER — BUPIVACAINE-EPINEPHRINE 0.25% -1:200000 IJ SOLN
INTRAMUSCULAR | Status: DC | PRN
  Administered 2021-02-06: 30 mL

## 2021-02-06 MED ORDER — ROCURONIUM BROMIDE 10 MG/ML (PF) SYRINGE
PREFILLED_SYRINGE | INTRAVENOUS | Status: AC
  Filled 2021-02-06: qty 10

## 2021-02-06 SURGICAL SUPPLY — 54 items
ADH SKN CLS APL DERMABOND .7 (GAUZE/BANDAGES/DRESSINGS) ×1
APL PRP STRL LF DISP 70% ISPRP (MISCELLANEOUS) ×1
BAG INFUSER PRESSURE 100CC (MISCELLANEOUS) IMPLANT
BAG SPEC RTRVL LRG 6X4 10 (ENDOMECHANICALS) ×1
BLADE SURG SZ11 CARB STEEL (BLADE) ×2 IMPLANT
CANISTER SUCT 1200ML W/VALVE (MISCELLANEOUS) IMPLANT
CANNULA REDUC XI 12-8 STAPL (CANNULA) ×1
CANNULA REDUCER 12-8 DVNC XI (CANNULA) ×1 IMPLANT
CHLORAPREP W/TINT 26 (MISCELLANEOUS) ×2 IMPLANT
CLIP VESOLOCK LG 6/CT PURPLE (CLIP) ×2 IMPLANT
CLIP VESOLOCK MED LG 6/CT (CLIP) ×2 IMPLANT
COVER WAND RF STERILE (DRAPES) ×2 IMPLANT
DECANTER SPIKE VIAL GLASS SM (MISCELLANEOUS) ×4 IMPLANT
DEFOGGER SCOPE WARMER CLEARIFY (MISCELLANEOUS) ×2 IMPLANT
DERMABOND ADVANCED (GAUZE/BANDAGES/DRESSINGS) ×1
DERMABOND ADVANCED .7 DNX12 (GAUZE/BANDAGES/DRESSINGS) ×1 IMPLANT
DRAPE ARM DVNC X/XI (DISPOSABLE) ×4 IMPLANT
DRAPE COLUMN DVNC XI (DISPOSABLE) ×1 IMPLANT
DRAPE DA VINCI XI ARM (DISPOSABLE) ×4
DRAPE DA VINCI XI COLUMN (DISPOSABLE) ×1
ELECT REM PT RETURN 9FT ADLT (ELECTROSURGICAL) ×2
ELECTRODE REM PT RTRN 9FT ADLT (ELECTROSURGICAL) ×1 IMPLANT
GLOVE SURG ENC MOIS LTX SZ6.5 (GLOVE) ×8 IMPLANT
GLOVE SURG UNDER POLY LF SZ6.5 (GLOVE) ×8 IMPLANT
GOWN STRL REUS W/ TWL LRG LVL3 (GOWN DISPOSABLE) ×4 IMPLANT
GOWN STRL REUS W/TWL LRG LVL3 (GOWN DISPOSABLE) ×8
GRASPER SUT TROCAR 14GX15 (MISCELLANEOUS) ×2 IMPLANT
IRRIGATOR SUCT 8 DISP DVNC XI (IRRIGATION / IRRIGATOR) IMPLANT
IRRIGATOR SUCTION 8MM XI DISP (IRRIGATION / IRRIGATOR)
IV NS 1000ML (IV SOLUTION)
IV NS 1000ML BAXH (IV SOLUTION) IMPLANT
KIT PINK PAD W/HEAD ARE REST (MISCELLANEOUS) ×2
KIT PINK PAD W/HEAD ARM REST (MISCELLANEOUS) ×1 IMPLANT
LABEL OR SOLS (LABEL) ×2 IMPLANT
MANIFOLD NEPTUNE II (INSTRUMENTS) ×2 IMPLANT
NEEDLE HYPO 22GX1.5 SAFETY (NEEDLE) ×2 IMPLANT
NEEDLE INSUFFLATION 14GA 120MM (NEEDLE) ×2 IMPLANT
NS IRRIG 500ML POUR BTL (IV SOLUTION) ×2 IMPLANT
OBTURATOR OPTICAL STANDARD 8MM (TROCAR) ×1
OBTURATOR OPTICAL STND 8 DVNC (TROCAR) ×1
OBTURATOR OPTICALSTD 8 DVNC (TROCAR) ×1 IMPLANT
PACK LAP CHOLECYSTECTOMY (MISCELLANEOUS) ×2 IMPLANT
POUCH SPECIMEN RETRIEVAL 10MM (ENDOMECHANICALS) ×2 IMPLANT
SEAL CANN UNIV 5-8 DVNC XI (MISCELLANEOUS) ×3 IMPLANT
SEAL XI 5MM-8MM UNIVERSAL (MISCELLANEOUS) ×3
SET TUBE SMOKE EVAC HIGH FLOW (TUBING) ×2 IMPLANT
SOLUTION ELECTROLUBE (MISCELLANEOUS) ×2 IMPLANT
SPONGE LAP 4X18 RFD (DISPOSABLE) IMPLANT
STAPLER CANNULA SEAL DVNC XI (STAPLE) ×1 IMPLANT
STAPLER CANNULA SEAL XI (STAPLE) ×1
SUT MNCRL 4-0 (SUTURE) ×2
SUT MNCRL 4-0 27XMFL (SUTURE) ×1
SUT VICRYL 0 AB UR-6 (SUTURE) ×2 IMPLANT
SUTURE MNCRL 4-0 27XMF (SUTURE) ×1 IMPLANT

## 2021-02-06 NOTE — Anesthesia Preprocedure Evaluation (Signed)
Anesthesia Evaluation  Patient identified by MRN, date of birth, ID band Patient awake    Reviewed: Allergy & Precautions, NPO status , Patient's Chart, lab work & pertinent test results  Airway Mallampati: II  TM Distance: >3 FB     Dental  (+) Teeth Intact   Pulmonary Current Smoker and Patient abstained from smoking.,    Pulmonary exam normal        Cardiovascular negative cardio ROS Normal cardiovascular exam     Neuro/Psych negative neurological ROS  negative psych ROS   GI/Hepatic Neg liver ROS,   Endo/Other  negative endocrine ROS  Renal/GU negative Renal ROS  negative genitourinary   Musculoskeletal negative musculoskeletal ROS (+)   Abdominal Normal abdominal exam  (+)   Peds negative pediatric ROS (+)  Hematology  (+) anemia ,   Anesthesia Other Findings Past Medical History: No date: Anemia  Reproductive/Obstetrics                             Anesthesia Physical Anesthesia Plan  ASA: II  Anesthesia Plan: General   Post-op Pain Management:    Induction: Intravenous  PONV Risk Score and Plan:   Airway Management Planned: Oral ETT  Additional Equipment:   Intra-op Plan:   Post-operative Plan: Extubation in OR  Informed Consent: I have reviewed the patients History and Physical, chart, labs and discussed the procedure including the risks, benefits and alternatives for the proposed anesthesia with the patient or authorized representative who has indicated his/her understanding and acceptance.     Dental advisory given  Plan Discussed with: CRNA and Surgeon  Anesthesia Plan Comments:         Anesthesia Quick Evaluation

## 2021-02-06 NOTE — Discharge Instructions (Addendum)
  Diet: Resume home heart healthy regular diet.   Activity: No heavy lifting >20 pounds (children, pets, laundry, garbage) or strenuous activity until follow-up, but light activity and walking are encouraged. Do not drive or drink alcohol if taking narcotic pain medications.  Wound care: May shower with soapy water and pat dry (do not rub incisions), but no baths or submerging incision underwater until follow-up. (no swimming)   Medications: Resume all home medications. For mild to moderate pain: acetaminophen (Tylenol) or ibuprofen (if no kidney disease). Combining Tylenol with alcohol can substantially increase your risk of causing liver disease. Narcotic pain medications, if prescribed, can be used for severe pain, though may cause nausea, constipation, and drowsiness. Do not combine Tylenol and Norco within a 6 hour period as Norco contains Tylenol. If you do not need the narcotic pain medication, you do not need to fill the prescription.  Call office ([phone]) at any time if any questions, worsening pain, fevers/chills, bleeding, drainage from incision site, or other concerns.   AMBULATORY SURGERY  DISCHARGE INSTRUCTIONS   1) The drugs that you were given will stay in your system until tomorrow so for the next 24 hours you should not:  A) Drive an automobile B) Make any legal decisions C) Drink any alcoholic beverage   2) You may resume regular meals tomorrow.  Today it is better to start with liquids and gradually work up to solid foods.  You may eat anything you prefer, but it is better to start with liquids, then soup and crackers, and gradually work up to solid foods.   3) Please notify your doctor immediately if you have any unusual bleeding, trouble breathing, redness and pain at the surgery site, drainage, fever, or pain not relieved by medication.    4) Additional Instructions:        Please contact your physician with any problems or Same Day Surgery at  [phone], Monday through Friday 6 am to 4 pm, or [facility] number at [phone]. 

## 2021-02-06 NOTE — Op Note (Signed)
Preoperative diagnosis: Cholelithiasis  Postoperative diagnosis: Cholelithiasis  Procedure: Robotic Assisted Laparoscopic Cholecystectomy.   Anesthesia: GETA   Surgeon: Dr. Hazle Quant  Wound Classification: Clean Contaminated  Indications: Patient is a 45 y.o. female developed right upper quadrant pain and on workup was found to have cholelithiasis with a normal common duct. Robotic Assisted Laparoscopic cholecystectomy was elected.  Findings:  Critical view of safety achieved     Cystic duct and artery identified, ligated and divided Adequate hemostasis  Description of procedure: The patient was placed on the operating table in the supine position. General anesthesia was induced. A time-out was completed verifying correct patient, procedure, site, positioning, and implant(s) and/or special equipment prior to beginning this procedure. An orogastric tube was placed. The abdomen was prepped and draped in the usual sterile fashion.  An incision was made in a natural skin line below the umbilicus.  The fascia was elevated and the Veress needle inserted. Proper position was confirmed by aspiration and saline meniscus test.  The abdomen was insufflated with carbon dioxide to a pressure of 15 mmHg. The patient tolerated insufflation well. A 8-mm trocar was then inserted in optiview fashion.  The laparoscope was inserted and the abdomen inspected. No injuries from initial trocar placement were noted. Additional trocars were then inserted in the following locations: an 8-mm trocar in the left lateral abdomen, and another two 8-mm trocars to the right side of the abdomen 5 cm appart. The umbilical trocar was changed to a 12 mm trocar all under direct visualization. The abdomen was inspected and no abnormalities were found. The table was placed in the reverse Trendelenburg position with the right side up. The robotic arms were docked and target anatomy identified. Instrument inserted under direct  visualization.  Filmy adhesions between the gallbladder and omentum, duodenum and transverse colon were lysed with electrocautery. The dome of the gallbladder was grasped with a prograsp and retracted over the dome of the liver. The infundibulum was also grasped with an atraumatic grasper and retracted toward the right lower quadrant. This maneuver exposed Calot's triangle. The peritoneum overlying the gallbladder infundibulum was then incised and the cystic duct and cystic artery identified and circumferentially dissected. Critical view of safety reviewed before ligating any structure. Firefly images taken to visualize biliary ducts. The cystic duct and cystic artery were then doubly clipped and divided close to the gallbladder.  The gallbladder was then dissected from its peritoneal attachments by electrocautery. Hemostasis was checked and the gallbladder and contained stones were removed using an endoscopic retrieval bag. The gallbladder was passed off the table as a specimen. There was no evidence of bleeding from the gallbladder fossa or cystic artery or leakage of the bile from the cystic duct stump. Secondary trocars were removed under direct vision. No bleeding was noted. The robotic arms were undoked. The scope was withdrawn and the umbilical trocar removed. The abdomen was allowed to collapse. The fascia of the 53mm trocar sites was closed with figure-of-eight 0 vicryl sutures. The skin was closed with subcuticular sutures of 4-0 monocryl and topical skin adhesive. The orogastric tube was removed.  The patient tolerated the procedure well and was taken to the postanesthesia care unit in stable condition.   Specimen: Gallbladder  Complications: None  EBL: 5 mL

## 2021-02-06 NOTE — Transfer of Care (Signed)
Immediate Anesthesia Transfer of Care Note  Patient: Gina Jennings  Procedure(s) Performed: XI ROBOTIC ASSISTED LAPAROSCOPIC CHOLECYSTECTOMY (N/A Abdomen)  Patient Location: PACU  Anesthesia Type:General  Level of Consciousness: awake  Airway & Oxygen Therapy: Patient Spontanous Breathing  Post-op Assessment: Report given to RN  Post vital signs: stable  Last Vitals:  Vitals Value Taken Time  BP 128/61 02/06/21 1400  Temp    Pulse 78 02/06/21 1403  Resp 26 02/06/21 1403  SpO2 95 % 02/06/21 1403  Vitals shown include unvalidated device data.  Last Pain:  Vitals:   02/06/21 1028  TempSrc: Tympanic  PainSc: 4       Patients Stated Pain Goal: 0 (02/06/21 1028)  Complications: No complications documented.

## 2021-02-06 NOTE — Interval H&P Note (Signed)
History and Physical Interval Note:  02/06/2021 11:32 AM  Gina Jennings  has presented today for surgery, with the diagnosis of K80.20 Cholelithiasis w/o cholecystitis.  The various methods of treatment have been discussed with the patient and family. After consideration of risks, benefits and other options for treatment, the patient has consented to  Procedure(s): XI ROBOTIC ASSISTED LAPAROSCOPIC CHOLECYSTECTOMY (N/A) as a surgical intervention.  The patient's history has been reviewed, patient examined, no change in status, stable for surgery.  I have reviewed the patient's chart and labs.  Questions were answered to the patient's satisfaction.     Carolan Shiver

## 2021-02-06 NOTE — Anesthesia Procedure Notes (Signed)
Procedure Name: Intubation Date/Time: 02/06/2021 12:12 PM Performed by: Zetta Bills, CRNA Pre-anesthesia Checklist: Patient identified, Emergency Drugs available, Suction available and Patient being monitored Patient Re-evaluated:Patient Re-evaluated prior to induction Oxygen Delivery Method: Circle system utilized Preoxygenation: Pre-oxygenation with 100% oxygen Induction Type: IV induction Ventilation: Mask ventilation without difficulty Laryngoscope Size: Mac and 3 Grade View: Grade II Tube type: Oral Tube size: 7.0 mm Number of attempts: 1 Airway Equipment and Method: Stylet Placement Confirmation: ETT inserted through vocal cords under direct vision Secured at: 20 cm Tube secured with: Tape Dental Injury: Teeth and Oropharynx as per pre-operative assessment

## 2021-02-07 LAB — SURGICAL PATHOLOGY

## 2021-02-07 NOTE — Anesthesia Postprocedure Evaluation (Signed)
Anesthesia Post Note  Patient: Gina Jennings  Procedure(s) Performed: XI ROBOTIC ASSISTED LAPAROSCOPIC CHOLECYSTECTOMY (N/A Abdomen)  Patient location during evaluation: PACU Anesthesia Type: General Level of consciousness: awake and alert and oriented Pain management: pain level controlled Vital Signs Assessment: post-procedure vital signs reviewed and stable Respiratory status: spontaneous breathing Cardiovascular status: blood pressure returned to baseline Anesthetic complications: no   No complications documented.   Last Vitals:  Vitals:   02/06/21 1457 02/06/21 1518  BP:  (!) 148/68  Pulse:  74  Resp: 14 12  Temp:  37.2 C  SpO2:  95%    Last Pain:  Vitals:   02/06/21 1518  TempSrc: Temporal  PainSc: 5                  Gayle Collard

## 2022-09-20 IMAGING — CT CT RENAL STONE PROTOCOL
3 of 4 series · 9 of 46 positions shown, 16 images · non-contrast
Comparison: None.

CLINICAL DATA: Right-sided flank pain.

EXAM:
CT ABDOMEN AND PELVIS WITHOUT CONTRAST
TECHNIQUE: Multidetector CT imaging of the abdomen and pelvis was performed
following the standard protocol without IV contrast.

[Series 4: lung bases · axial · 0.67mm/px · z∈[-177,-107]mm · 5 of 22 slices shown, 10 images]
[im 4/22  soft-tissue]
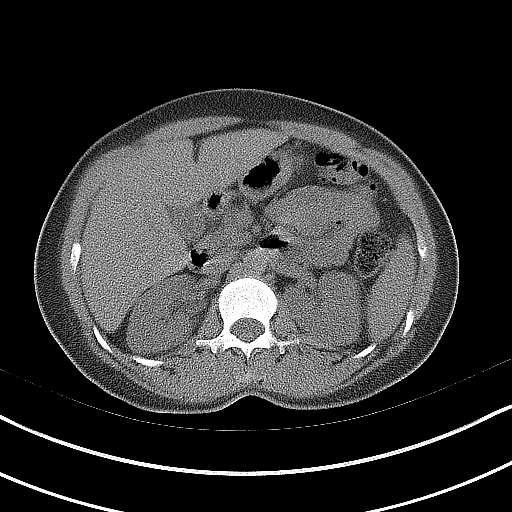
[im 4/22  bone]
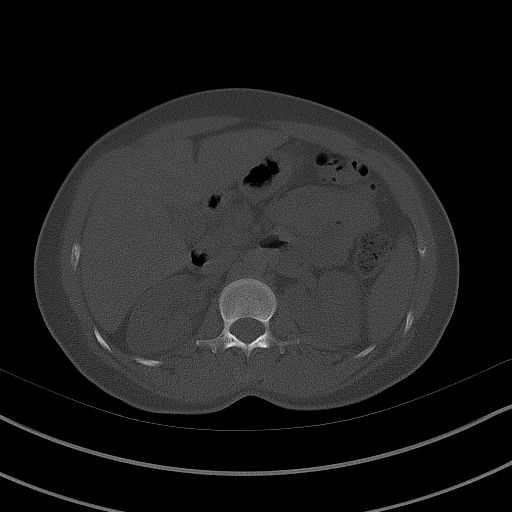
[im 8/22  soft-tissue]
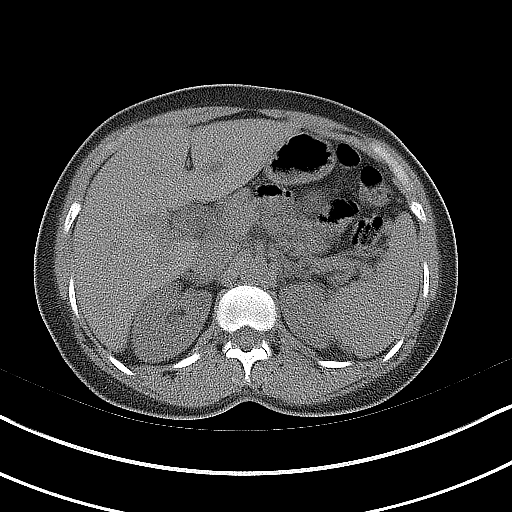
[im 8/22  lung]
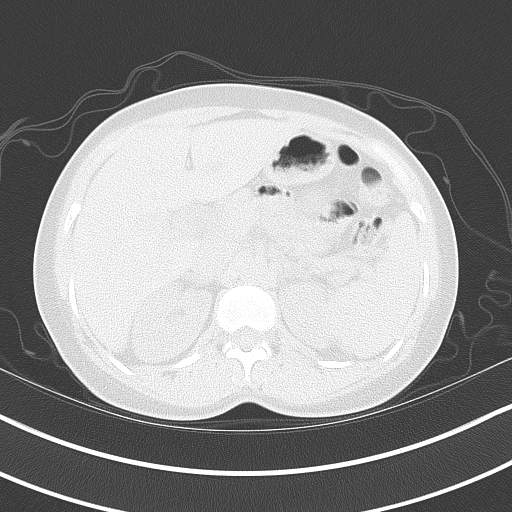
[im 11/22  soft-tissue]
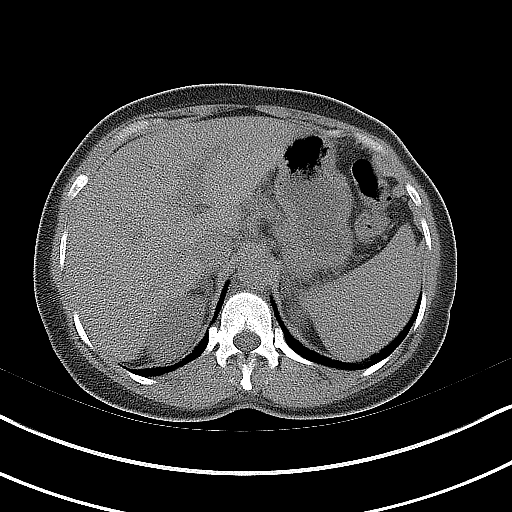
[im 11/22  lung]
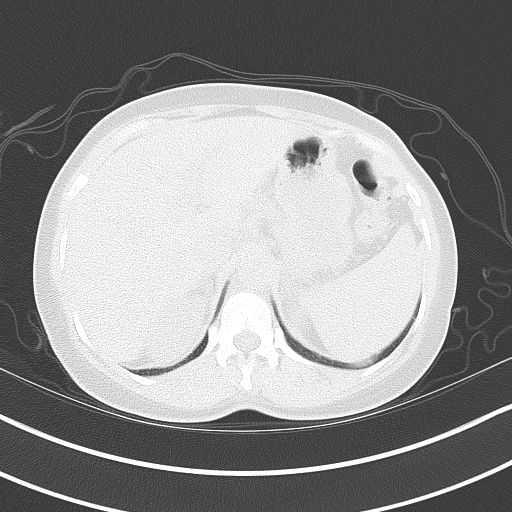
[im 15/22  soft-tissue]
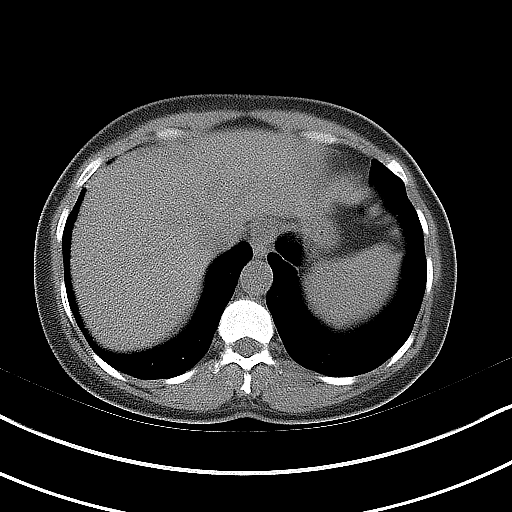
[im 15/22  lung]
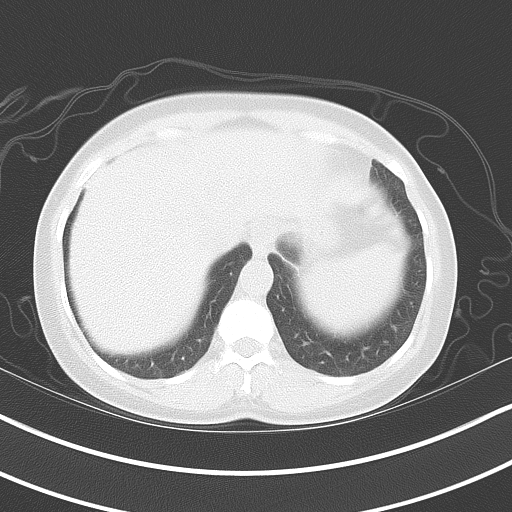
[im 18/22  soft-tissue]
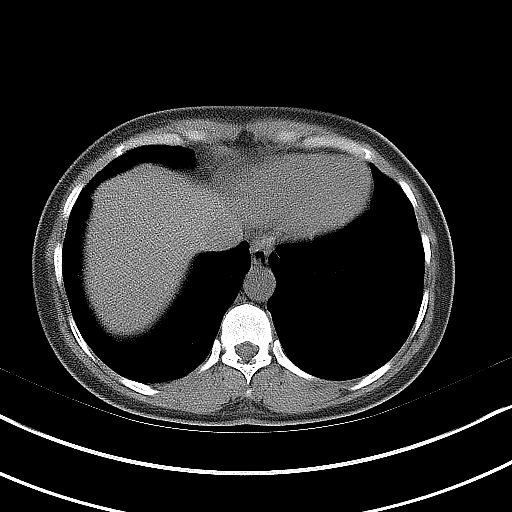
[im 18/22  lung]
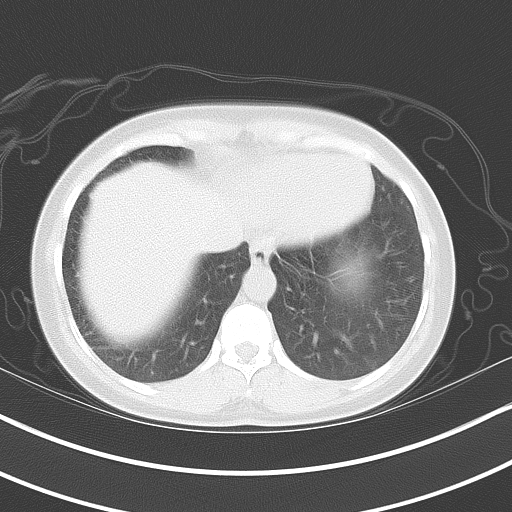

[Series 5: coronal · coronal · 0.71mm/px · 3 of 108 slices shown, 4 images]
[im 36/108  soft-tissue]
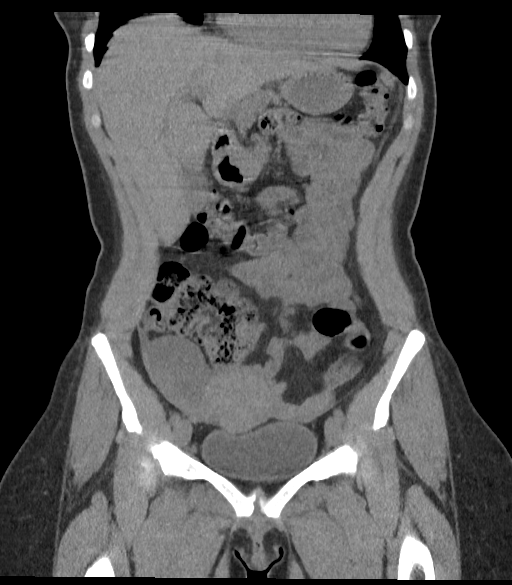
[im 48/108  soft-tissue]
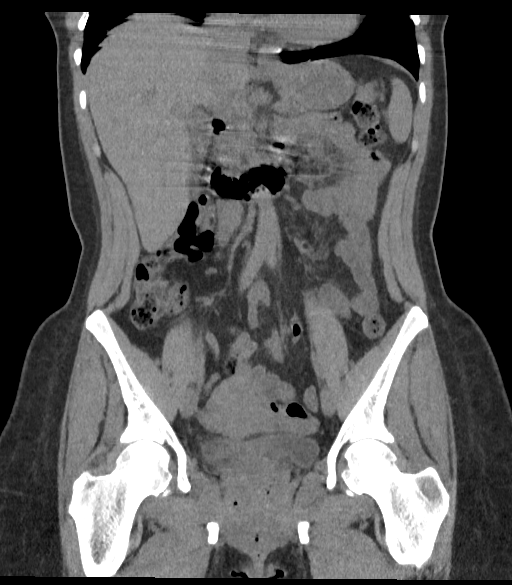
[im 48/108  bone]
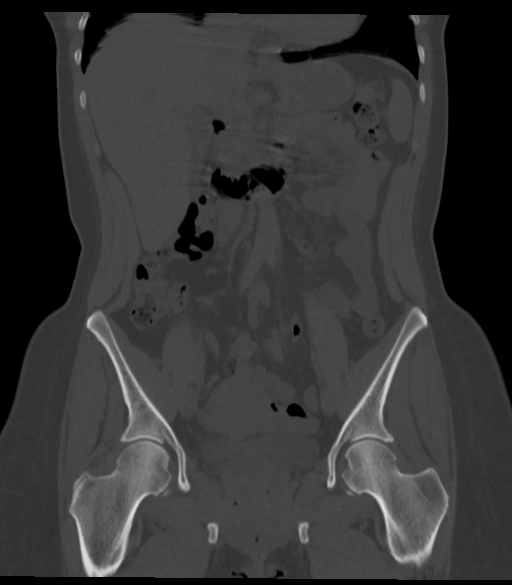
[im 60/108  soft-tissue]
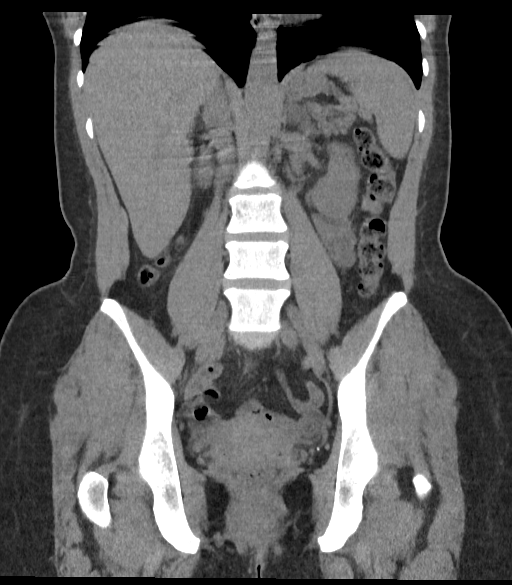

[Series 6: sagittal · sagittal · 0.52mm/px · 1 of 168 slices shown, 2 images]
[im 56/168  soft-tissue]
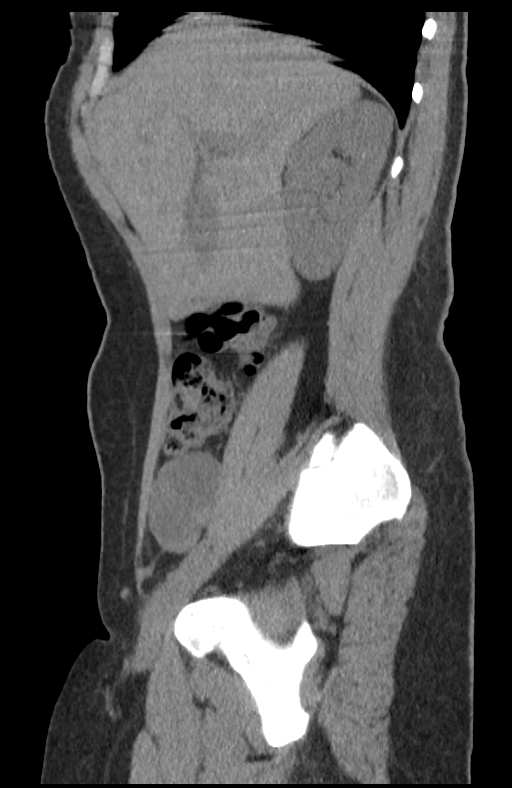
[im 56/168  bone]
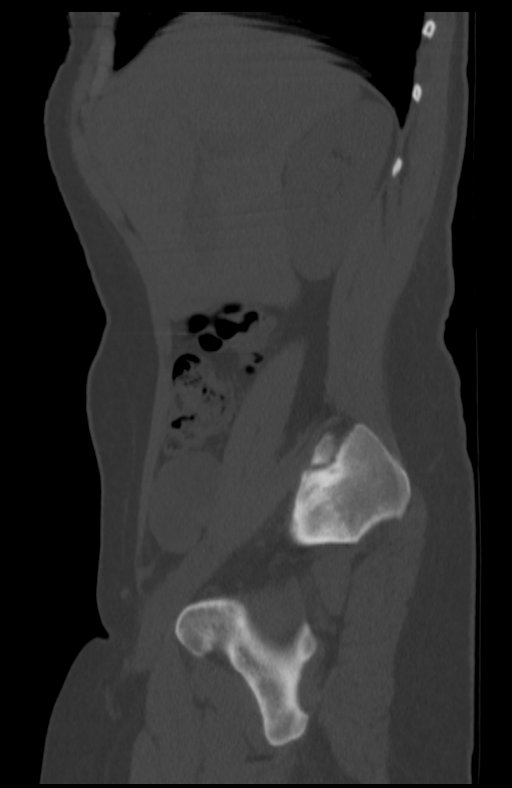

[9 of 46 positions shown; findings below may reference images not displayed]

FINDINGS: Lower chest: Normal

Hepatobiliary: No liver parenchymal abnormality. Question stones or
sludge of the gallbladder. Question some gallbladder wall
thickening. Could the patient's symptoms relate to gallbladder
disease?

Pancreas: Normal

Spleen: Normal

Adrenals/Urinary Tract: Adrenal glands are normal. Kidneys are
normal. No cyst, mass, stone or hydronephrosis. Bladder is normal.

Stomach/Bowel: Stomach and small intestine are normal. No
significant: No significant finding relating to the colon.

Vascular/Lymphatic: Early atherosclerotic change of the aorta. No
aneurysm. IVC is normal. No retroperitoneal adenopathy.

Reproductive: Right ovarian cyst maximal measurement 4.2 cm.

Other: Small amount of free fluid in the pelvic cul-de-sac, not
unusual in a premenopausal female.

Musculoskeletal: Normal
IMPRESSION: 1. Question stones or sludge of the gallbladder. Question some
gallbladder wall thickening. Could the patient's symptoms relate to
gallbladder disease? No sign of urinary tract pathology by CT.
2. Right ovarian cyst maximal measurement 4.2 cm. No follow-up
imaging recommended. Note: This recommendation does not apply to
premenarchal patients and to those with increased risk (genetic,
family history, elevated tumor markers or other high-risk factors)
of ovarian cancer. Reference: JACR [DATE]):[id number]
3. Small amount of free fluid in the pelvic cul-de-sac, not unusual
in a premenopausal female.

Aortic Atherosclerosis ([id number]-[id number].L).

## 2022-09-20 IMAGING — US US ABDOMEN LIMITED RUQ/ASCITES
1 series · 14 of 25 positions shown · non-contrast
Comparison: None.

CLINICAL DATA: Upper abdominal pain

EXAM:
ULTRASOUND ABDOMEN LIMITED RIGHT UPPER QUADRANT

[Series 1: us abdomen limited ruq/ascites · 0.19mm/px · 14 of 49 slices shown]
[im 1/49]
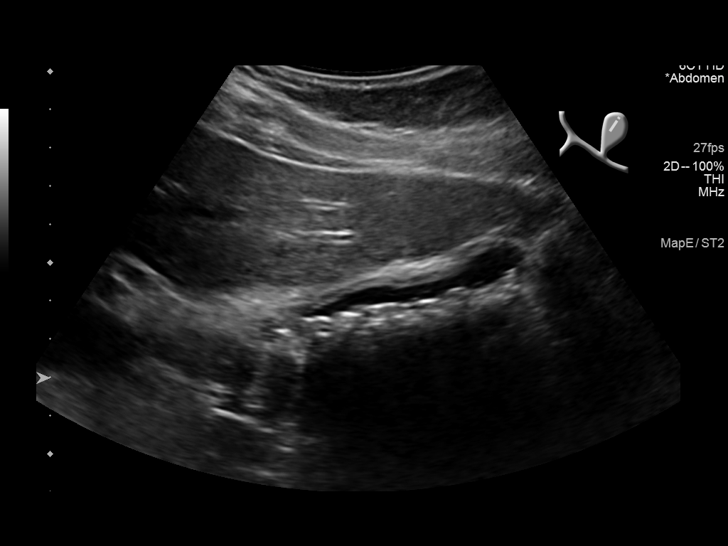
[im 5/49]
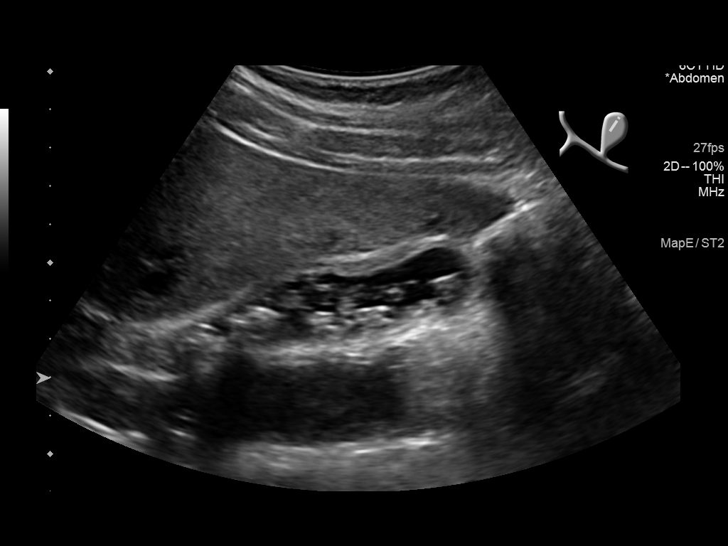
[im 9/49]
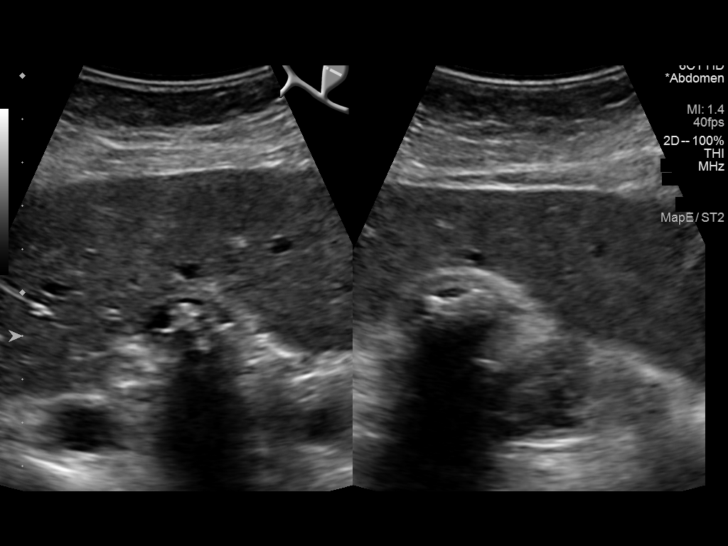
[im 13/49]
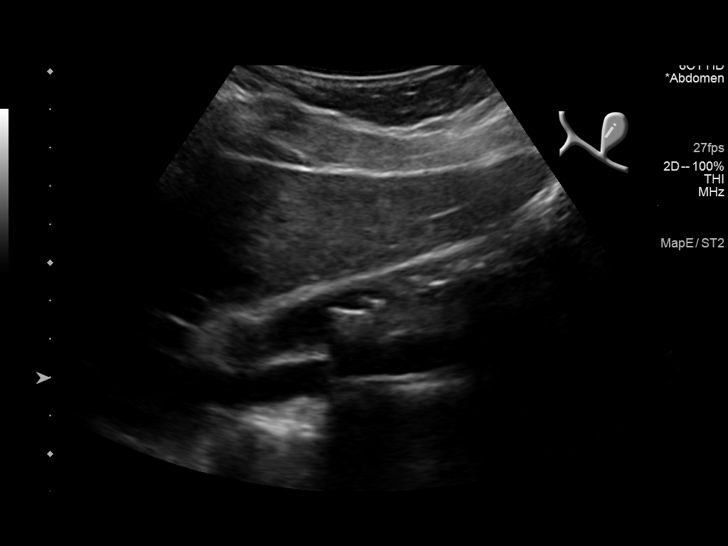
[im 17/49]
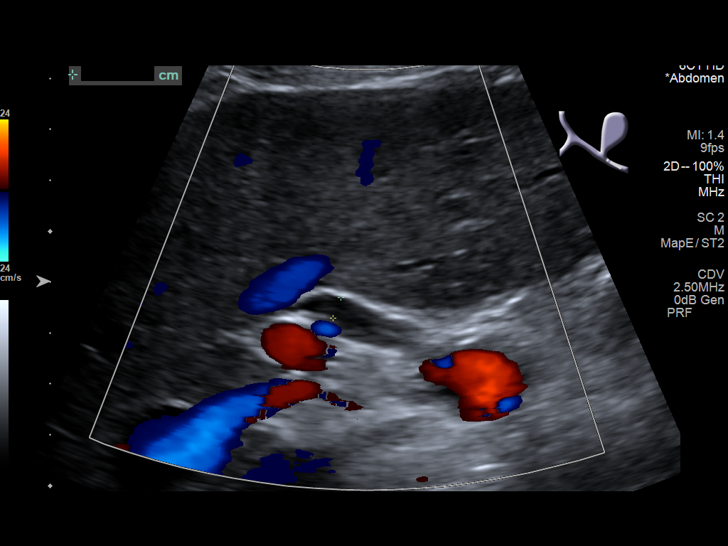
[im 19/49]
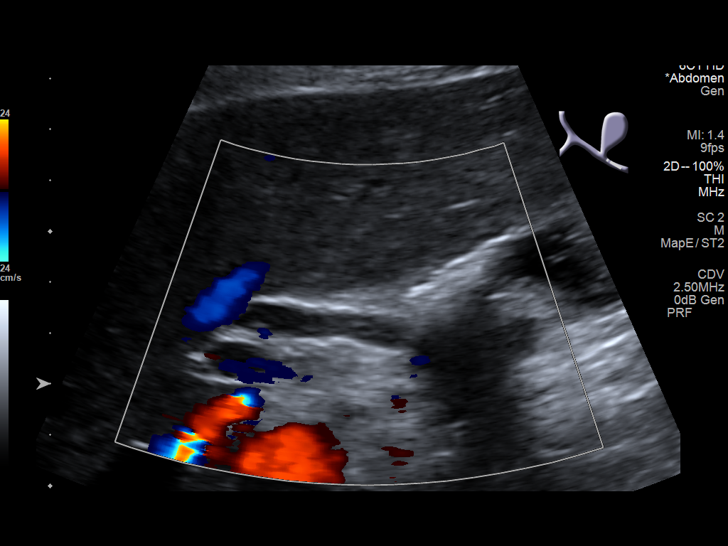
[im 23/49]
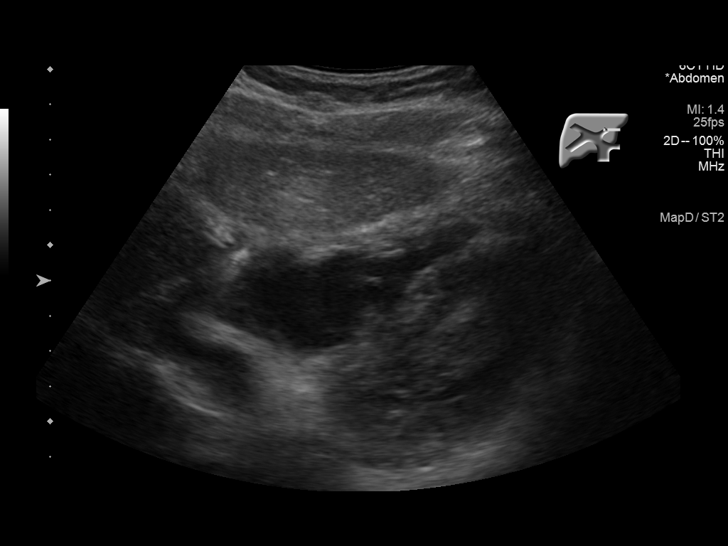
[im 27/49]
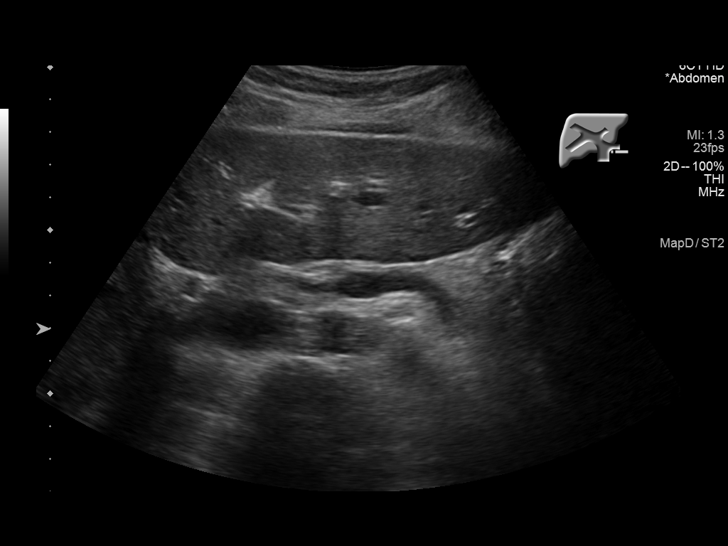
[im 31/49]
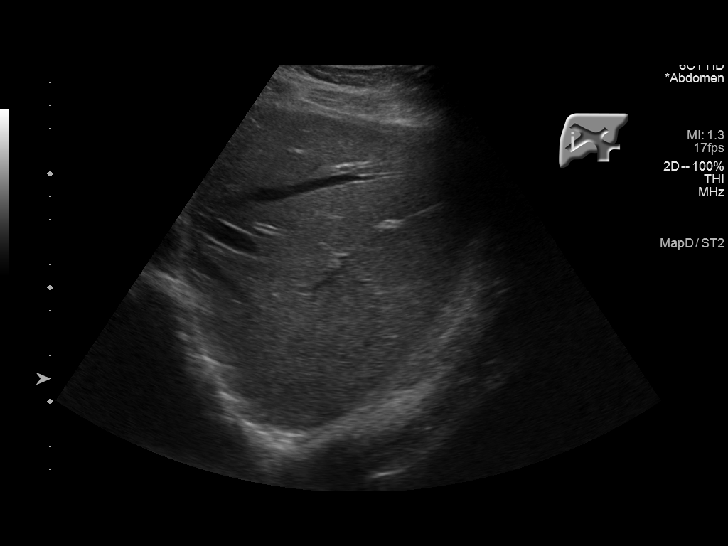
[im 33/49]
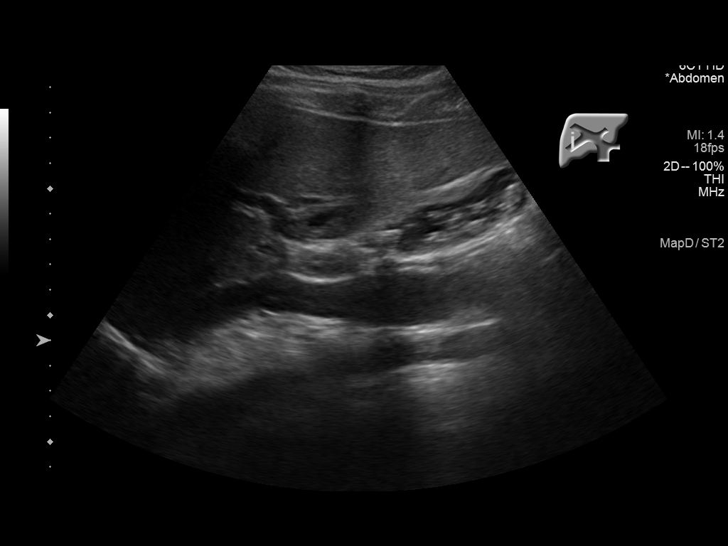
[im 37/49]
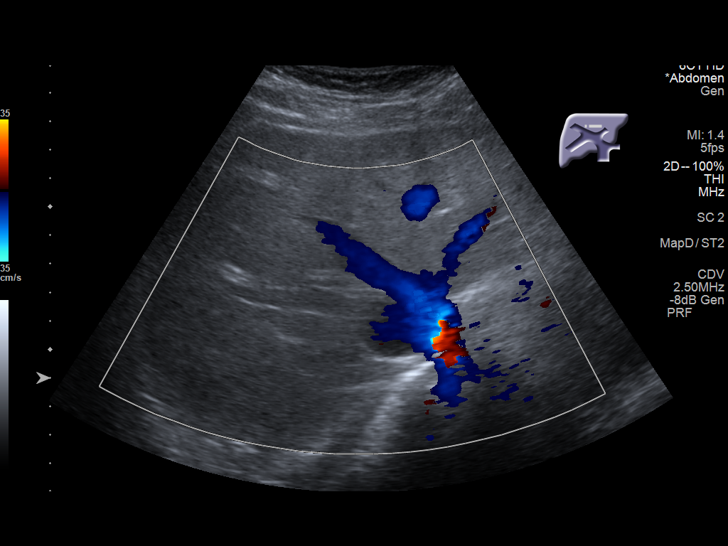
[im 41/49]
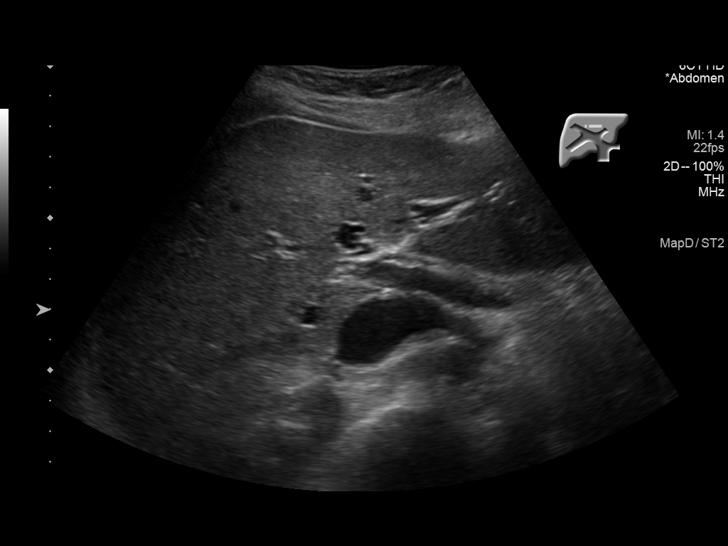
[im 45/49]
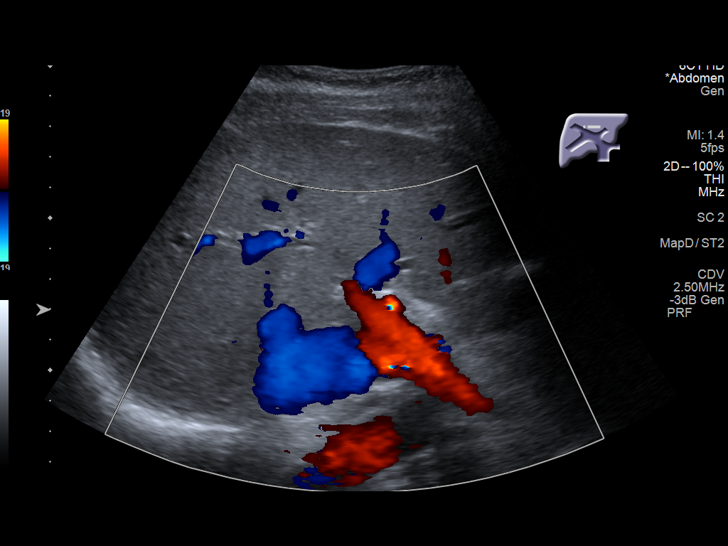
[im 49/49]
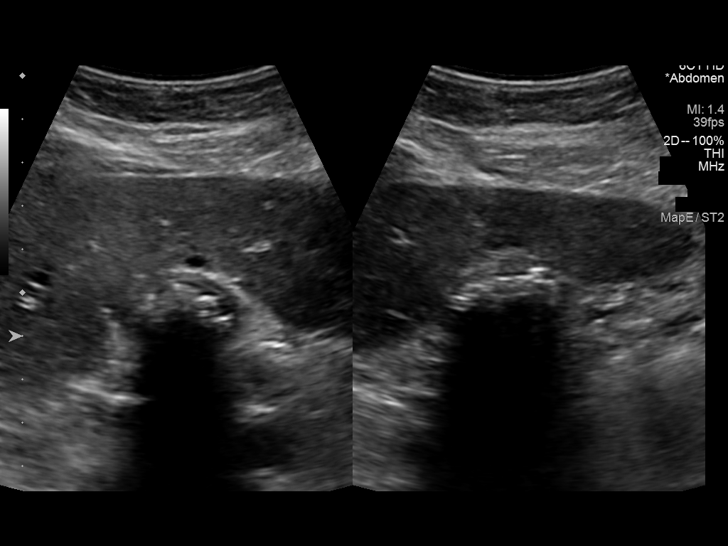

[14 of 25 positions shown; findings below may reference images not displayed]

FINDINGS: Gallbladder:

Within the gallbladder, there are multiple echogenic foci which move
and shadow consistent with cholelithiasis. Largest individual
gallstone measures 8 mm. There is associated sludge within the
gallbladder. The gallbladder wall is borderline thickened. There is
no pericholecystic fluid. No sonographic Murphy sign noted by
sonographer.

Common bile duct:

Diameter: 6 mm. No intrahepatic or extrahepatic biliary duct
dilatation.

Liver:

No focal lesion identified. Within normal limits in parenchymal
echogenicity. Portal vein is patent on color Doppler imaging with
normal direction of blood flow towards the liver.

Other: None.
IMPRESSION: Cholelithiasis and sludge within the gallbladder. Gallbladder wall
borderline thickened. This combination of findings potentially could
be indicative of early acute cholecystitis. In this regard, it may
be prudent to consider nuclear medicine hepatobiliary gene study to
assess for cystic duct patency.

Study otherwise unremarkable.

## 2023-06-24 ENCOUNTER — Ambulatory Visit: Admitting: Family Medicine

## 2023-06-24 ENCOUNTER — Encounter: Payer: Self-pay | Admitting: Family Medicine

## 2023-06-24 DIAGNOSIS — Z113 Encounter for screening for infections with a predominantly sexual mode of transmission: Secondary | ICD-10-CM

## 2023-06-24 LAB — HM HIV SCREENING LAB: HM HIV Screening: NEGATIVE

## 2023-06-24 LAB — WET PREP FOR TRICH, YEAST, CLUE
Trichomonas Exam: NEGATIVE
Yeast Exam: NEGATIVE

## 2023-06-24 NOTE — Progress Notes (Signed)
[facility] Department  STI clinic/screening visit [address] [place] [address] [phone]  Subjective:  Gina Jennings is a 47 y.o. female being seen today for an STI screening visit. The patient reports they do not have symptoms.  Patient reports that they do not desire a pregnancy in the next year.   They reported they are not interested in discussing contraception today.    No LMP recorded.  Patient has the following medical conditions:  There are no problems to display for this patient.   Chief Complaint  Patient presents with   SEXUALLY TRANSMITTED DISEASE    HPI  Patient reports to clinic for STI testing. Reports her partner had a positive STI test- but he would not tell her which one   Does the patient using douching products? No  Last HIV test per patient/review of record was No results found for: "HMHIVSCREEN" No results found for: "HIV" Patient reports last pap was No results found for: "DIAGPAP" No results found for: "SPECADGYN"  Screening for MPX risk: Does the patient have an unexplained rash? No Is the patient MSM? No Does the patient endorse multiple sex partners or anonymous sex partners? No Did the patient have close or sexual contact with a person diagnosed with MPX? No Has the patient traveled outside the [place] where MPX is endemic? No Is there a high clinical suspicion for MPX-- evidenced by one of the following No  -Unlikely to be chickenpox  -Lymphadenopathy  -Rash that present in same phase of evolution on any given body part See flowsheet for further details and programmatic requirements.   Immunization history:   There is no immunization history on file for this patient.   The following portions of the patient's history were reviewed and updated as appropriate: allergies, current medications, past medical history, past social history, past surgical history and problem list.  Objective:  There were no vitals  filed for this visit.  Physical Exam Vitals and nursing note reviewed.  Constitutional:      Appearance: Normal appearance.  HENT:     Head: Normocephalic and atraumatic.     Mouth/Throat:     Mouth: Mucous membranes are moist.     Pharynx: Oropharynx is clear. No oropharyngeal exudate or posterior oropharyngeal erythema.  Pulmonary:     Effort: Pulmonary effort is normal.  Abdominal:     General: Abdomen is flat.     Palpations: There is no mass.     Tenderness: There is no abdominal tenderness. There is no rebound.  Genitourinary:    General: Normal vulva.     Exam position: Lithotomy position.     Pubic Area: No rash or pubic lice.      Labia:        Right: No rash or lesion.        Left: No rash or lesion.      Vagina: Normal. No vaginal discharge, erythema, bleeding or lesions.     Cervix: No discharge, lesion or erythema.       Comments: pH = 5  Bimanual exam not done- practitioner oversight  Lymphadenopathy:     Head:     Right side of head: No preauricular or posterior auricular adenopathy.     Left side of head: No preauricular or posterior auricular adenopathy.     Cervical: No cervical adenopathy.     Upper Body:     Right upper body: No supraclavicular, axillary or epitrochlear adenopathy.  Left upper body: No supraclavicular, axillary or epitrochlear adenopathy.     Lower Body: No right inguinal adenopathy. No left inguinal adenopathy.  Skin:    General: Skin is warm and dry.     Findings: No rash.  Neurological:     Mental Status: She is alert and oriented to person, place, and time.      Assessment and Plan:  Gina Jennings is a 47 y.o. female presenting to the [facility] Department for STI screening  1. Screening for venereal disease LMP 7/22- has had unprotected sex since then  - Chlamydia/Gonorrhea [place] [facility] - HIV [place] [facility] - Syphilis Serology,  Lab - WET PREP FOR TRICH, YEAST, CLUE - Gonococcus  culture   Patient accepted all screenings including oral, vaginal CT/GC and bloodwork for HIV/RPR, and wet prep. Patient meets criteria for HepB screening? No. Ordered? not applicable Patient meets criteria for HepC screening? No. Ordered? not applicable  Treat wet prep per standing order Discussed time line for [facility] results and that patient will be called with positive results and encouraged patient to call if she had not heard in 2 weeks.  Counseled to return or seek care for continued or worsening symptoms Recommended repeat testing in 3 months with positive results. Recommended condom use with all sex  Patient is currently using  nothing  to prevent pregnancy.    Return if symptoms worsen or fail to improve, for STI screening.  No future appointments. Total time spent 20 minutes Lenice Llamas, [place]

## 2023-06-24 NOTE — Progress Notes (Signed)
Pt is here for STD screening.  Wet mount results reviewed, no treatment required per SO.  Condoms given.  Shayle Donahoo M Christ Fullenwider, RN  

## 2023-07-01 ENCOUNTER — Telehealth: Payer: Self-pay

## 2023-07-01 NOTE — Telephone Encounter (Signed)
Attempted to reach client by phone regarding COVID-19 vaccine appointment on 07/02/2023.  Call went to Voicemail.   Francely Craw Sherrilyn Rist, RN

## 2023-07-02 ENCOUNTER — Ambulatory Visit (LOCAL_COMMUNITY_HEALTH_CENTER)

## 2023-07-02 ENCOUNTER — Ambulatory Visit

## 2023-07-02 DIAGNOSIS — Z719 Counseling, unspecified: Secondary | ICD-10-CM

## 2023-07-02 DIAGNOSIS — Z23 Encounter for immunization: Secondary | ICD-10-CM | POA: Diagnosis not present

## 2023-07-02 NOTE — Progress Notes (Signed)
Pt seen in nurse clinic for first dose Covid 19 vaccine. Pt's insurance doesn't cover vaccines per Roxy (billing dept). Eligible for Moderna through [employer]. Given VIS, agreed for [employer] 12y+, year 2023-2024, administered, monitored for 15 minutes without problems. Given NCIR copy, explained and understood. M.Montray Kliebert, LPN.

## 2023-12-09 ENCOUNTER — Other Ambulatory Visit: Payer: Self-pay

## 2023-12-09 DIAGNOSIS — R509 Fever, unspecified: Secondary | ICD-10-CM | POA: Diagnosis present

## 2023-12-09 DIAGNOSIS — R350 Frequency of micturition: Secondary | ICD-10-CM | POA: Insufficient documentation

## 2023-12-09 DIAGNOSIS — Z20822 Contact with and (suspected) exposure to covid-19: Secondary | ICD-10-CM | POA: Insufficient documentation

## 2023-12-09 DIAGNOSIS — J101 Influenza due to other identified influenza virus with other respiratory manifestations: Secondary | ICD-10-CM | POA: Insufficient documentation

## 2023-12-09 LAB — URINALYSIS, ROUTINE W REFLEX MICROSCOPIC
Bilirubin Urine: NEGATIVE
Glucose, UA: NEGATIVE mg/dL
Hgb urine dipstick: NEGATIVE
Ketones, ur: NEGATIVE mg/dL
Leukocytes,Ua: NEGATIVE
Nitrite: NEGATIVE
Protein, ur: 100 mg/dL — AB
RBC / HPF: >50 RBC/hpf (ref 0–5)
Specific Gravity, Urine: 1.028 (ref 1.005–1.030)
pH: 5 (ref 5.0–8.0)

## 2023-12-09 LAB — RESP PANEL BY RT-PCR (RSV, FLU A&B, COVID)  RVPGX2
Influenza A by PCR: POSITIVE — AB
Influenza B by PCR: NEGATIVE
Resp Syncytial Virus by PCR: NEGATIVE
SARS Coronavirus 2 by RT PCR: NEGATIVE

## 2023-12-09 MED ORDER — ACETAMINOPHEN 325 MG PO TABS
650.0000 mg | ORAL_TABLET | Freq: Once | ORAL | Status: AC
  Administered 2023-12-09: 650 mg via ORAL
  Filled 2023-12-09: qty 2

## 2023-12-09 NOTE — ED Triage Notes (Addendum)
Pt reports she developed n/v/d and fever for the past few days. Pt states she took tylenol last at 0800 this morning. Pt also reports dysuria

## 2023-12-10 ENCOUNTER — Emergency Department
Admission: EM | Admit: 2023-12-10 | Discharge: 2023-12-10 | Disposition: A | Discharge status: Home / Self Care | Attending: Emergency Medicine | Admitting: Emergency Medicine

## 2023-12-10 DIAGNOSIS — J101 Influenza due to other identified influenza virus with other respiratory manifestations: Secondary | ICD-10-CM | POA: Diagnosis not present

## 2023-12-10 DIAGNOSIS — J111 Influenza due to unidentified influenza virus with other respiratory manifestations: Secondary | ICD-10-CM

## 2023-12-10 DIAGNOSIS — R509 Fever, unspecified: Secondary | ICD-10-CM

## 2023-12-10 MED ORDER — OSELTAMIVIR PHOSPHATE 75 MG PO CAPS: 75.0000 mg | ORAL_CAPSULE | Freq: Two times a day (BID) | ORAL | 0 refills | Status: DC

## 2023-12-10 MED ORDER — ONDANSETRON 4 MG PO TBDP: 4.0000 mg | ORAL_TABLET | Freq: Three times a day (TID) | ORAL | 0 refills | Status: DC | PRN

## 2023-12-10 MED ORDER — IBUPROFEN 600 MG PO TABS
600.0000 mg | ORAL_TABLET | Freq: Once | ORAL | Status: AC
  Administered 2023-12-10: 600 mg via ORAL
  Filled 2023-12-10: qty 1

## 2023-12-10 MED ORDER — ONDANSETRON 4 MG PO TBDP
4.0000 mg | ORAL_TABLET | Freq: Once | ORAL | Status: AC
  Administered 2023-12-10: 4 mg via ORAL
  Filled 2023-12-10: qty 1

## 2023-12-10 NOTE — ED Notes (Signed)
No answer when called several times from lobby 

## 2023-12-10 NOTE — ED Provider Notes (Signed)
[facility] Provider Note    Event Date/Time   First MD Initiated Contact with Patient 12/10/23 [phone]     (approximate)   History   Fever   HPI  Gina Jennings is a 48 y.o. female  who presents to the ED from home with a chief complaint of fever, sore throat, body aches, cough, congestion, N/V/D x 2-3 days. Vomiting and diarrhea have resolved but patient continues with fever and body aches. Denies chest pain, sob, abdominal pain. Also endorses urinary frequency.       Past Medical History   Past Medical History:  Diagnosis Date   Anemia      Active Problem List  There are no active problems to display for this patient.    Past Surgical History   Past Surgical History:  Procedure Laterality Date   CESAREAN SECTION       Home Medications   Prior to Admission medications   Medication Sig Start Date End Date Taking? Authorizing Provider  ondansetron (ZOFRAN-ODT) 4 MG disintegrating tablet Take 1 tablet (4 mg total) by mouth every 8 (eight) hours as needed for nausea or vomiting. 12/10/23  Yes Irean Hong, MD  oseltamivir (TAMIFLU) 75 MG capsule Take 1 capsule (75 mg total) by mouth 2 (two) times daily. 12/10/23  Yes Irean Hong, MD  ergocalciferol (VITAMIN D2) 1.25 MG (50000 UT) capsule Take 50,000 Units by mouth every Sunday. 06/02/20   [provider]  naproxen sodium (ALEVE) 220 MG tablet Take 440 mg by mouth at bedtime as needed (pain).    [provider]  norethindrone-ethinyl estradiol (LOESTRIN FE) 1-20 MG-MCG tablet Take 1 tablet by mouth daily. 06/02/20   [provider]  valACYclovir (VALTREX) 500 MG tablet Take 500 mg by mouth daily as needed (outbreaks).    [provider]     Allergies  Patient has no known allergies.   Family History  History reviewed. No pertinent family history.   Physical Exam  Triage Vital Signs: ED Triage Vitals  Encounter Vitals Group     BP 12/09/23  2223 132/65     Systolic BP Percentile --      Diastolic BP Percentile --      Pulse Rate 12/09/23 2223 92     Resp 12/09/23 2223 18     Temp 12/09/23 2223 (!) 101.4 F (38.6 C)     Temp Source 12/09/23 2223 Oral     SpO2 12/09/23 2223 96 %     Weight 12/09/23 2228 142 lb (64.4 kg)     Height 12/09/23 2228 5\' 2"  (1.575 m)     Head Circumference --      Peak Flow --      Pain Score 12/09/23 2228 6     Pain Loc --      Pain Education --      Exclude from Growth Chart --     Updated Vital Signs: BP 120/62   Pulse 79   Temp 98.6 F (37 C) (Oral)   Resp 18   Ht 5\' 2"  (1.575 m)   Wt 64.4 kg   SpO2 95%   BMI 25.97 kg/m    General: Awake, no distress.  CV:  RRR. Good peripheral perfusion.  Resp:  Normal effort. CTAB. Abd:  No distention.  Other:  Mild erythematous oropharynx without tonsillar swelling, exudates or peritonsillar abscess. There is no hoarse or muffled voice. There is no drooling. Shotty anterior cervical LAD.  ED Results / Procedures / Treatments  Labs (all labs ordered are listed, but only abnormal results are displayed) Labs Reviewed  RESP PANEL BY RT-PCR (RSV, FLU A&B, COVID)  RVPGX2 - Abnormal; Notable for the following components:      Result Value   Influenza A by PCR POSITIVE (*)    All other components within normal limits  URINALYSIS, ROUTINE W REFLEX MICROSCOPIC - Abnormal; Notable for the following components:   Color, Urine YELLOW (*)    APPearance HAZY (*)    Protein, ur 100 (*)    Bacteria, UA RARE (*)    All other components within normal limits     EKG  None   RADIOLOGY None   Official radiology report(s): No results found.   PROCEDURES:  Critical Care performed: No  Procedures   MEDICATIONS ORDERED IN ED: Medications  ibuprofen (ADVIL) tablet 600 mg (has no administration in time range)  acetaminophen (TYLENOL) tablet 650 mg (650 mg Oral Given 12/09/23 2231)  ondansetron (ZOFRAN-ODT) disintegrating tablet 4 mg (4  mg Oral Given 12/10/23 0426)     IMPRESSION / MDM / ASSESSMENT AND PLAN / ED COURSE  I reviewed the triage vital signs and the nursing notes.                             48 year old female presenting with cold symptoms. She is positive for Flu A. Desires to start Tamiflu. Will also prescribe ODT Zofran to use as needed. Strict return precautions given. Patient verbalizes understanding and agrees with plan of care.  Patient's presentation is most consistent with acute, uncomplicated illness.    FINAL CLINICAL IMPRESSION(S) / ED DIAGNOSES   Final diagnoses:  Influenza  Fever, unspecified fever cause     Rx / DC Orders   ED Discharge Orders          Ordered    oseltamivir (TAMIFLU) 75 MG capsule  2 times daily        12/10/23 0512    ondansetron (ZOFRAN-ODT) 4 MG disintegrating tablet  Every 8 hours PRN        12/10/23 1610             Note:  This document was prepared using Dragon voice recognition software and may include unintentional dictation errors.   Irean Hong, MD 12/10/23 [phone]

## 2023-12-10 NOTE — Discharge Instructions (Signed)
Start Tamiflu today and take as prescribed until finished. You may take Zofran as needed for nausea/vomiting. Drink plenty of fluids daily. Alternate Tylenol and Ibuprofen every 4 hours as needed for fever > 100.4. Return to the ER for worsening symptoms, persistent vomiting, difficulty breathing or other concerns.

## 2024-01-20 ENCOUNTER — Other Ambulatory Visit (HOSPITAL_COMMUNITY)
Admission: RE | Admit: 2024-01-20 | Discharge: 2024-01-20 | Disposition: A | Discharge status: Home / Self Care | Payer: PRIVATE HEALTH INSURANCE | Source: Ambulatory Visit | Attending: Family Medicine | Admitting: Family Medicine

## 2024-01-20 ENCOUNTER — Encounter: Payer: Self-pay | Admitting: Family Medicine

## 2024-01-20 ENCOUNTER — Ambulatory Visit (INDEPENDENT_AMBULATORY_CARE_PROVIDER_SITE_OTHER): Payer: PRIVATE HEALTH INSURANCE | Admitting: Family Medicine

## 2024-01-20 VITALS — BP 119/67 | HR 68 | Ht 62.0 in | Wt 129.0 lb

## 2024-01-20 DIAGNOSIS — N951 Menopausal and female climacteric states: Secondary | ICD-10-CM

## 2024-01-20 DIAGNOSIS — R300 Dysuria: Secondary | ICD-10-CM

## 2024-01-20 DIAGNOSIS — Z114 Encounter for screening for human immunodeficiency virus [HIV]: Secondary | ICD-10-CM

## 2024-01-20 DIAGNOSIS — Z113 Encounter for screening for infections with a predominantly sexual mode of transmission: Secondary | ICD-10-CM

## 2024-01-20 DIAGNOSIS — Z130 Encounter for screening for diseases of the blood and blood-forming organs and certain disorders involving the immune mechanism: Secondary | ICD-10-CM

## 2024-01-20 DIAGNOSIS — Z1211 Encounter for screening for malignant neoplasm of colon: Secondary | ICD-10-CM

## 2024-01-20 DIAGNOSIS — E559 Vitamin D deficiency, unspecified: Secondary | ICD-10-CM

## 2024-01-20 DIAGNOSIS — Z7689 Persons encountering health services in other specified circumstances: Secondary | ICD-10-CM | POA: Insufficient documentation

## 2024-01-20 DIAGNOSIS — N898 Other specified noninflammatory disorders of vagina: Secondary | ICD-10-CM

## 2024-01-20 DIAGNOSIS — Z1231 Encounter for screening mammogram for malignant neoplasm of breast: Secondary | ICD-10-CM

## 2024-01-20 DIAGNOSIS — F17210 Nicotine dependence, cigarettes, uncomplicated: Secondary | ICD-10-CM

## 2024-01-20 DIAGNOSIS — Z1159 Encounter for screening for other viral diseases: Secondary | ICD-10-CM

## 2024-01-20 MED ORDER — ESTRADIOL 0.1 MG/GM VA CREA
1.0000 | TOPICAL_CREAM | Freq: Every day | VAGINAL | 12 refills | Status: AC
Start: 2024-01-20 — End: ?

## 2024-01-20 NOTE — Assessment & Plan Note (Addendum)
 Welcome to [facility] Concerns today: intermittent dysuria and vaginal dryness Will prescribed intravaginal estrace Will check UA w/ culture reflex  Mammo, cologuard ordered Screening for Hep C and HIV CBC, CMP, TSH, lipid  F/u 5-6 months for physical with PAP

## 2024-01-20 NOTE — Assessment & Plan Note (Signed)
 Still getting period every other month Discussed menopause = 366 days with no period LMP January 2025 Increase vaginal dryness - estrace cream prescribed Increased intermittent dysuria, may be related to increased vaginal dryness and peri menopausal symptoms.  No concerns with mood or weight gain BP normal

## 2024-01-20 NOTE — Assessment & Plan Note (Addendum)
 Intermittent past two months, associated with some pelvic pain Declines symptoms today Denies fever, chills, burning, itching, odor, urine color change Will check UA w/ culture reflex and STI vaginal swab Could be related to perimenopausal change Started on vaginal estrace for vaginal dryness Increase water intake Pee and keep cleanly after sex CBC checked as well

## 2024-01-20 NOTE — Assessment & Plan Note (Signed)
 Smoking cessation discussed 1/3 ppd Pt aware of risks, continues to be precontemplation

## 2024-01-20 NOTE — Progress Notes (Signed)
 Established Patient Office Visit  Introduced to nurse practitioner role and practice setting.  All questions answered.  Discussed provider/patient relationship and expectations.   Subjective   Patient ID: Gina Jennings, female    DOB: August 30, 1976  Age: 48 y.o. MRN: [id number]  Chief Complaint  Patient presents with   New Patient (Initial Visit)    Gina Jennings is a 48 year old female who presents for establishment of primary care and evaluation of urinary symptoms. She was previously under the care of NP in [place], which she was last seen about 3 years ago.  Concerns today include: intermittent dysuria and vaginal dryness.  She has been experiencing urinary symptoms for approximately two months, suggestive of a possible urinary tract infection. Symptoms include discomfort during urination, which she describes as 'uncomfortable when I look something.' These symptoms have been intermittent, with occasional flare-ups. She attempted self-treatment with over-the-counter azo but has decided to seek medical evaluation. Currently, she has no urinary symptoms today such as burning, fever, back pain, or chills.  Her menstrual history reveals irregular periods over the past six months, with missed periods in December and February, followed by heavy bleeding. She describes her periods as 'really, really heavy' and lasting about six days. She also reports vaginal dryness and discomfort during intercourse, which she attributes to perimenopausal changes. LMP sometime January 2025.  She has a history of insomnia, previously managed with Ambien, and a history of depression, which was more pronounced after the birth of her second daughter- no longer an issue per pt currently  Socially, she has been a Lawyer for eleven years, currently working at [employer]. She smokes approximately one pack of Newports every three days and drinks alcohol socially. She has been married for eight years  and has two daughters, aged 16 and 76. She enjoys gardening and listening to R&B music.       01/20/2024    3:34 PM  Depression screen PHQ 2/9  Decreased Interest 0  Down, Depressed, Hopeless 0  PHQ - 2 Score 0  Altered sleeping 3  Tired, decreased energy 1  Change in appetite 2  Feeling bad or failure about yourself  0  Trouble concentrating 0  Moving slowly or fidgety/restless 0  Suicidal thoughts 0  PHQ-9 Score 6  Difficult doing work/chores Somewhat difficult       01/20/2024    3:34 PM  GAD 7 : Generalized Anxiety Score  Nervous, Anxious, on Edge 0  Control/stop worrying 0  Worry too much - different things 0  Trouble relaxing 0  Restless 0  Easily annoyed or irritable 0  Afraid - awful might happen 0  Total GAD 7 Score 0  Anxiety Difficulty Not difficult at all     Review of Systems  All other systems reviewed and are negative.   Negative unless indicated in HPI   Objective:     BP 119/67   Pulse 68   Ht 5\' 2"  (1.575 m)   Wt 129 lb (58.5 kg)   BMI 23.59 kg/m    Physical Exam Constitutional:      General: She is not in acute distress.    Appearance: Normal appearance. She is obese. She is not toxic-appearing or diaphoretic.  HENT:     Head: Normocephalic.     Right Ear: Tympanic membrane, ear canal and external ear normal.     Left Ear: Tympanic membrane, ear canal and external ear normal.     Nose: Nose normal.  Mouth/Throat:     Mouth: Mucous membranes are moist.     Dentition: Abnormal dentition.     Pharynx: Oropharynx is clear.  Eyes:     Extraocular Movements: Extraocular movements intact.     Pupils: Pupils are equal, round, and reactive to light.  Neck:     Vascular: No carotid bruit.  Cardiovascular:     Rate and Rhythm: Normal rate and regular rhythm.     Pulses: Normal pulses.     Heart sounds: Normal heart sounds. No murmur heard.    No friction rub. No gallop.  Pulmonary:     Effort: No respiratory distress.     Breath  sounds: No stridor. No wheezing, rhonchi or rales.  Chest:     Chest wall: No tenderness.  Abdominal:     Tenderness: There is no right CVA tenderness or left CVA tenderness.  Musculoskeletal:     Right lower leg: No edema.     Left lower leg: No edema.  Lymphadenopathy:     Cervical: No cervical adenopathy.  Skin:    General: Skin is warm and dry.     Capillary Refill: Capillary refill takes less than 2 seconds.  Neurological:     General: No focal deficit present.     Mental Status: She is alert and oriented to person, place, and time. Mental status is at baseline.  Psychiatric:        Attention and Perception: Attention and perception normal.        Mood and Affect: Mood normal.        Speech: Speech normal.        Behavior: Behavior normal.        Thought Content: Thought content normal.        Cognition and Memory: Cognition and memory normal.        Judgment: Judgment normal.      No results found for any visits on 01/20/24.    The ASCVD Risk score (Arnett DK, et al., 2019) failed to calculate for the following reasons:   Cannot find a previous HDL lab   Cannot find a previous total cholesterol lab    Assessment & Plan:  Dysuria Assessment & Plan: Intermittent past two months, associated with some pelvic pain Declines symptoms today Denies fever, chills, burning, itching, odor, urine color change Will check UA w/ culture reflex and STI vaginal swab Could be related to perimenopausal change Started on vaginal estrace for vaginal dryness Increase water intake Pee and keep cleanly after sex CBC checked as well  Orders: -     CBC with Differential/Platelet -     Cervicovaginal ancillary only -     Urinalysis, Routine w reflex microscopic  Peri-menopausal Assessment & Plan: Still getting period every other month Discussed menopause = 366 days with no period LMP January 2025 Increase vaginal dryness - estrace cream prescribed Increased intermittent  dysuria, may be related to increased vaginal dryness and peri menopausal symptoms.  No concerns with mood or weight gain BP normal  Orders: -     CBC with Differential/Platelet -     Comprehensive metabolic panel -     TSH -     VITAMIN D 25 Hydroxy (Vit-D Deficiency, Fractures) -     Lipid panel -     Estradiol; Place 1 Applicatorful vaginally at bedtime. For two weeks. Then twice a week vaginally for vaginal dryness.  Dispense: 42.5 g; Refill: 12  Vaginal dryness Assessment & Plan: Chronic Most  likely due to perimenopausal changes Sex uncomfortable  Would like to try intravaginal estrogen to help with symptoms, as she has tried OTC non hormonal lubricants  Orders: -     Estradiol; Place 1 Applicatorful vaginally at bedtime. For two weeks. Then twice a week vaginally for vaginal dryness.  Dispense: 42.5 g; Refill: 12  Vitamin D deficiency Assessment & Plan: Hx of vitamin D deficiency and taking supplement Will check vitamin levels today  Orders: -     VITAMIN D 25 Hydroxy (Vit-D Deficiency, Fractures)  Tobacco dependence due to cigarettes Assessment & Plan: Smoking cessation discussed 1/3 ppd Pt aware of risks, continues to be precontemplation  Orders: -     CBC with Differential/Platelet  Screening examination for STI -     Cervicovaginal ancillary only  Encounter for hepatitis C screening test for low risk patient -     Hepatitis C antibody  Encounter for screening for HIV -     HIV Antibody (routine testing w rflx)  Screening for deficiency anemia -     CBC with Differential/Platelet  Screening mammogram for breast cancer -     3D Screening Mammogram, Left and Right; Future  Screening for colon cancer -     Cologuard  Encounter to establish care with new doctor Assessment & Plan: Welcome to [facility] Concerns today: intermittent dysuria and vaginal dryness Will prescribed intravaginal estrace Will check UA w/ culture reflex  Mammo, cologuard  ordered Screening for Hep C and HIV CBC, CMP, TSH, lipid  F/u 5-6 months for physical with PAP     Return in about 6 months (around 07/22/2024) for annual physical w/ PAP.   I, Sallee Provencal, FNP, have reviewed all documentation for this visit. The documentation on 01/20/24 for the exam, diagnosis, procedures, and orders are all accurate and complete.   Sallee Provencal, FNP

## 2024-01-20 NOTE — Assessment & Plan Note (Signed)
 Chronic Most likely due to perimenopausal changes Sex uncomfortable  Would like to try intravaginal estrogen to help with symptoms, as she has tried OTC non hormonal lubricants

## 2024-01-20 NOTE — Assessment & Plan Note (Signed)
 Hx of vitamin D deficiency and taking supplement Will check vitamin levels today

## 2024-01-21 ENCOUNTER — Encounter: Payer: Self-pay | Admitting: Family Medicine

## 2024-01-21 LAB — CBC WITH DIFFERENTIAL/PLATELET
Basophils Absolute: 0 10*3/uL (ref 0.0–0.2)
Basos: 0 %
EOS (ABSOLUTE): 0.1 10*3/uL (ref 0.0–0.4)
Eos: 1 %
Hematocrit: 38.2 % (ref 34.0–46.6)
Hemoglobin: 12.5 g/dL (ref 11.1–15.9)
Immature Grans (Abs): 0 10*3/uL (ref 0.0–0.1)
Immature Granulocytes: 0 %
Lymphocytes Absolute: 2 10*3/uL (ref 0.7–3.1)
Lymphs: 28 %
MCH: 29.6 pg (ref 26.6–33.0)
MCHC: 32.7 g/dL (ref 31.5–35.7)
MCV: 90 fL (ref 79–97)
Monocytes Absolute: 0.6 10*3/uL (ref 0.1–0.9)
Monocytes: 9 %
Neutrophils Absolute: 4.4 10*3/uL (ref 1.4–7.0)
Neutrophils: 62 %
Platelets: 143 10*3/uL — ABNORMAL LOW (ref 150–450)
RBC: 4.23 x10E6/uL (ref 3.77–5.28)
RDW: 14 % (ref 11.7–15.4)
WBC: 7.1 10*3/uL (ref 3.4–10.8)

## 2024-01-21 LAB — MICROSCOPIC EXAMINATION
Casts: NONE SEEN /LPF
WBC, UA: NONE SEEN /HPF (ref 0–5)

## 2024-01-21 LAB — COMPREHENSIVE METABOLIC PANEL
ALT: 8 IU/L (ref 0–32)
AST: 14 IU/L (ref 0–40)
Albumin: 4.2 g/dL (ref 3.9–4.9)
Alkaline Phosphatase: 67 IU/L (ref 44–121)
BUN/Creatinine Ratio: 9 (ref 9–23)
BUN: 6 mg/dL (ref 6–24)
Bilirubin Total: 0.5 mg/dL (ref 0.0–1.2)
CO2: 24 mmol/L (ref 20–29)
Calcium: 9.3 mg/dL (ref 8.7–10.2)
Chloride: 100 mmol/L (ref 96–106)
Creatinine, Ser: 0.69 mg/dL (ref 0.57–1.00)
Globulin, Total: 3 g/dL (ref 1.5–4.5)
Glucose: 87 mg/dL (ref 70–99)
Potassium: 4.1 mmol/L (ref 3.5–5.2)
Sodium: 142 mmol/L (ref 134–144)
Total Protein: 7.2 g/dL (ref 6.0–8.5)
eGFR: 108 mL/min/{1.73_m2} (ref >59)

## 2024-01-21 LAB — URINALYSIS, ROUTINE W REFLEX MICROSCOPIC
Bilirubin, UA: NEGATIVE
Glucose, UA: NEGATIVE
Leukocytes,UA: NEGATIVE
Nitrite, UA: NEGATIVE
Specific Gravity, UA: 1.027 (ref 1.005–1.030)
Urobilinogen, Ur: 1 mg/dL (ref 0.2–1.0)
pH, UA: 6 (ref 5.0–7.5)

## 2024-01-21 LAB — LIPID PANEL
Chol/HDL Ratio: 3.8 ratio (ref 0.0–4.4)
Cholesterol, Total: 168 mg/dL (ref 100–199)
HDL: 44 mg/dL (ref >39)
LDL Chol Calc (NIH): 114 mg/dL — ABNORMAL HIGH (ref 0–99)
Triglycerides: 50 mg/dL (ref 0–149)
VLDL Cholesterol Cal: 10 mg/dL (ref 5–40)

## 2024-01-21 LAB — VITAMIN D 25 HYDROXY (VIT D DEFICIENCY, FRACTURES): Vit D, 25-Hydroxy: 16.5 ng/mL — ABNORMAL LOW (ref 30.0–100.0)

## 2024-01-21 LAB — SPECIMEN STATUS REPORT

## 2024-01-21 LAB — TSH: TSH: 1.26 u[IU]/mL (ref 0.450–4.500)

## 2024-01-21 LAB — HIV ANTIBODY (ROUTINE TESTING W REFLEX): HIV Screen 4th Generation wRfx: NONREACTIVE

## 2024-01-21 LAB — HEPATITIS C ANTIBODY: Hep C Virus Ab: NONREACTIVE

## 2024-01-22 LAB — CERVICOVAGINAL ANCILLARY ONLY
Bacterial Vaginitis (gardnerella): NEGATIVE
Candida Glabrata: NEGATIVE
Candida Vaginitis: NEGATIVE
Chlamydia: NEGATIVE
Comment: NEGATIVE
Comment: NEGATIVE
Comment: NEGATIVE
Comment: NEGATIVE
Comment: NEGATIVE
Comment: NORMAL
Neisseria Gonorrhea: NEGATIVE
Trichomonas: NEGATIVE

## 2024-02-05 LAB — COLOGUARD: COLOGUARD: NEGATIVE

## 2024-07-22 ENCOUNTER — Encounter: Payer: PRIVATE HEALTH INSURANCE | Admitting: Family Medicine

## 2024-08-07 ENCOUNTER — Encounter: Payer: Self-pay | Admitting: Family Medicine

## 2024-08-07 ENCOUNTER — Ambulatory Visit (INDEPENDENT_AMBULATORY_CARE_PROVIDER_SITE_OTHER): Payer: PRIVATE HEALTH INSURANCE | Admitting: Family Medicine

## 2024-08-07 VITALS — BP 138/66 | HR 69 | Temp 98.4°F | Ht 62.0 in | Wt 136.2 lb

## 2024-08-07 DIAGNOSIS — F17210 Nicotine dependence, cigarettes, uncomplicated: Secondary | ICD-10-CM | POA: Diagnosis not present

## 2024-08-07 DIAGNOSIS — R030 Elevated blood-pressure reading, without diagnosis of hypertension: Secondary | ICD-10-CM | POA: Diagnosis not present

## 2024-08-07 DIAGNOSIS — E559 Vitamin D deficiency, unspecified: Secondary | ICD-10-CM | POA: Diagnosis not present

## 2024-08-07 DIAGNOSIS — Z0001 Encounter for general adult medical examination with abnormal findings: Secondary | ICD-10-CM | POA: Diagnosis not present

## 2024-08-07 DIAGNOSIS — N898 Other specified noninflammatory disorders of vagina: Secondary | ICD-10-CM

## 2024-08-07 DIAGNOSIS — Z0000 Encounter for general adult medical examination without abnormal findings: Secondary | ICD-10-CM

## 2024-08-07 DIAGNOSIS — R011 Cardiac murmur, unspecified: Secondary | ICD-10-CM

## 2024-08-07 DIAGNOSIS — N951 Menopausal and female climacteric states: Secondary | ICD-10-CM | POA: Diagnosis not present

## 2024-08-07 DIAGNOSIS — Z23 Encounter for immunization: Secondary | ICD-10-CM

## 2024-08-07 NOTE — Progress Notes (Signed)
 Complete physical exam  Patient: Gina Jennings   DOB: Jan 12, 1976   48 y.o. Female  MRN: [id number]  Subjective:    Chief Complaint  Patient presents with   Annual Exam    Patient reports she is here for a pap and physical.  Diet- General Exercise- None Overall Feeling- Good Sleep- Good Concerns- None  Wants all vaccines except Flu.    Gina Jennings is a 48 y.o. female who presents today for a complete physical exam. She reports consuming a general diet. The patient does not participate in regular exercise at present. She generally feels well. She reports sleeping well. She does have additional problems to discuss today.       Most recent fall risk assessment:     No data to display           Most recent depression screenings:    01/20/2024    3:34 PM  PHQ 2/9 Scores  PHQ - 2 Score 0  PHQ- 9 Score 6      01/20/2024    3:34 PM  GAD 7 : Generalized Anxiety Score  Nervous, Anxious, on Edge 0  Control/stop worrying 0  Worry too much - different things 0  Trouble relaxing 0  Restless 0  Easily annoyed or irritable 0  Afraid - awful might happen 0  Total GAD 7 Score 0  Anxiety Difficulty Not difficult at all     Vision:Not within last year  and Dental: No current dental problems and Current dental problems  Patient Active Problem List   Diagnosis Date Noted   Dysuria 01/20/2024   Peri-menopausal 01/20/2024   Vaginal dryness 01/20/2024   Vitamin D  deficiency 01/20/2024   Encounter to establish care with new doctor 01/20/2024   Tobacco dependence due to cigarettes 01/20/2024   Past Medical History:  Diagnosis Date   Anemia    Blood transfusion without reported diagnosis    Depression    Murmur, cardiac    Past Surgical History:  Procedure Laterality Date   CESAREAN SECTION     CHOLECYSTECTOMY     TUBAL LIGATION     Social History   Tobacco Use   Smoking status: Every Day    Current packs/day: 0.50    Average packs/day:  0.5 packs/day for 20.0 years (10.0 ttl pk-yrs)    Types: Cigarettes   Smokeless tobacco: Never  Vaping Use   Vaping status: Never Used  Substance Use Topics   Alcohol use: Yes    Alcohol/week: 1.0 standard drink of alcohol    Types: 1 Cans of beer per week    Comment: OCC WINE   Drug use: Never   No Known Allergies    Patient Care Team: Wellington Curtis LABOR, FNP as PCP - General (Family Medicine)   Outpatient Medications Prior to Visit  Medication Sig   estradiol  (ESTRACE  VAGINAL) 0.1 MG/GM vaginal cream Place 1 Applicatorful vaginally at bedtime. For two weeks. Then twice a week vaginally for vaginal dryness.   No facility-administered medications prior to visit.    ROS        Objective:     BP 138/66 (BP Location: Left Arm, Patient Position: Sitting, Cuff Size: Normal)   Pulse 69   Temp 98.4 F (36.9 C) (Oral)   Ht 5' 2 (1.575 m)   Wt 136 lb 3.2 oz (61.8 kg)   SpO2 100%   BMI 24.91 kg/m  BP Readings from Last 3 Encounters:  08/07/24 138/66  01/20/24 119/67  12/10/23 120/62   Wt Readings from Last 3 Encounters:  08/07/24 136 lb 3.2 oz (61.8 kg)  01/20/24 129 lb (58.5 kg)  12/09/23 142 lb (64.4 kg)      Physical Exam Constitutional:      General: She is not in acute distress.    Appearance: Normal appearance. She is normal weight. She is not ill-appearing.  HENT:     Head: Normocephalic.     Right Ear: Tympanic membrane normal. There is no impacted cerumen.     Left Ear: Tympanic membrane normal. There is no impacted cerumen.     Nose: Nose normal.     Mouth/Throat:     Mouth: Mucous membranes are moist.     Pharynx: Oropharynx is clear. No oropharyngeal exudate or posterior oropharyngeal erythema.  Eyes:     General: Lids are normal.     Extraocular Movements: Extraocular movements intact.     Right eye: Normal extraocular motion.     Left eye: Normal extraocular motion.     Conjunctiva/sclera: Conjunctivae normal.     Right eye: Right  conjunctiva is not injected.     Left eye: Left conjunctiva is not injected.     Pupils: Pupils are equal, round, and reactive to light.  Neck:     Thyroid: No thyroid mass, thyromegaly or thyroid tenderness.  Cardiovascular:     Rate and Rhythm: Normal rate.     Pulses: Normal pulses.          Radial pulses are 2+ on the right side and 2+ on the left side.       Posterior tibial pulses are 2+ on the right side and 2+ on the left side.     Heart sounds: S1 normal and S2 normal. Murmur heard.     No friction rub. No gallop.  Pulmonary:     Effort: Pulmonary effort is normal. No respiratory distress.     Breath sounds: Normal breath sounds. No stridor. No wheezing, rhonchi or rales.  Abdominal:     General: Bowel sounds are normal. There is no distension.     Palpations: Abdomen is soft. There is no mass.     Tenderness: There is no abdominal tenderness. There is no right CVA tenderness, left CVA tenderness, guarding or rebound.     Hernia: No hernia is present.  Genitourinary:    Comments: Not assessed, holding PAP ,menstruating  Musculoskeletal:        General: No swelling or tenderness. Normal range of motion.     Cervical back: Normal range of motion. No rigidity.  Lymphadenopathy:     Cervical: No cervical adenopathy.     Right cervical: No superficial, deep or posterior cervical adenopathy.    Left cervical: No superficial, deep or posterior cervical adenopathy.  Skin:    General: Skin is warm and dry.     Capillary Refill: Capillary refill takes less than 2 seconds.     Findings: No bruising or erythema.  Neurological:     General: No focal deficit present.     Mental Status: She is alert and oriented to person, place, and time. Mental status is at baseline.     GCS: GCS eye subscore is 4. GCS verbal subscore is 5. GCS motor subscore is 6.     Cranial Nerves: No cranial nerve deficit.     Sensory: No sensory deficit.     Motor: No weakness, tremor or pronator drift.      Coordination: Romberg sign  negative. Coordination normal.     Gait: Gait is intact. Gait normal.  Psychiatric:        Attention and Perception: Attention and perception normal.        Mood and Affect: Mood and affect normal.        Speech: Speech normal.        Behavior: Behavior normal. Behavior is cooperative.        Thought Content: Thought content normal.        Cognition and Memory: Cognition and memory normal.        Judgment: Judgment normal.      No results found for any visits on 08/07/24. Last CBC Lab Results  Component Value Date   WBC 7.1 01/20/2024   HGB 12.5 01/20/2024   HCT 38.2 01/20/2024   MCV 90 01/20/2024   MCH 29.6 01/20/2024   RDW 14.0 01/20/2024   PLT 143 (L) 01/20/2024   Last metabolic panel Lab Results  Component Value Date   GLUCOSE 87 01/20/2024   NA 142 01/20/2024   K 4.1 01/20/2024   CL 100 01/20/2024   CO2 24 01/20/2024   BUN 6 01/20/2024   CREATININE 0.69 01/20/2024   EGFR 108 01/20/2024   CALCIUM 9.3 01/20/2024   PROT 7.2 01/20/2024   ALBUMIN 4.2 01/20/2024   LABGLOB 3.0 01/20/2024   BILITOT 0.5 01/20/2024   ALKPHOS 67 01/20/2024   AST 14 01/20/2024   ALT 8 01/20/2024   ANIONGAP 7 01/24/2021   Last lipids Lab Results  Component Value Date   CHOL 168 01/20/2024   HDL 44 01/20/2024   LDLCALC 114 (H) 01/20/2024   TRIG 50 01/20/2024   CHOLHDL 3.8 01/20/2024   Last hemoglobin A1c No results found for: HGBA1C Last thyroid functions Lab Results  Component Value Date   TSH 1.260 01/20/2024   Last vitamin D  Lab Results  Component Value Date   VD25OH 16.5 (L) 01/20/2024        Assessment & Plan:    Routine Health Maintenance and Physical Exam  Problem List Items Addressed This Visit       Genitourinary   Vaginal dryness     Other   Peri-menopausal   Vitamin D  deficiency   Tobacco dependence due to cigarettes   Other Visit Diagnoses       Annual physical exam    -  Primary     Cardiac murmur         Elevated  blood-pressure reading without diagnosis of hypertension           Annual Physical  Things to do to keep yourself healthy  - Exercise at least 30-45 minutes a day, 3-4 days a week.  - Eat a low-fat diet with lots of fruits and vegetables, up to 7-9 servings per day.  - Seatbelts can save your life. Wear them always.  - Smoke detectors on every level of your home, check batteries every year.  - Eye Doctor - have an eye exam every 1-2 years  - Safe sex - if you may be exposed to STDs, use a condom.  - Alcohol -  If you drink, do it moderately, less than 2 drinks per day.  - Health Care Power of Attorney. Choose someone to speak for you if you are not able.  - Depression is common in our stressful world.If you're feeling down or losing interest in things you normally enjoy, please come in for a visit.  - Violence - If anyone  is threatening or hurting you, please call immediately.  Pt is schedule mammogram - Reschedule PAP   Perimenopause and & Vaginal dryness - restarted estrace  cream - pt felt helpful - Previous period January 2025, just started on Sunday, 08/02/24 - no period for 366 days = menopause - other symptoms managable at this time  Tobacco Dependance - has decreased to 2-3 per day  - Keep up the great work!!  Vitamin D  Deficiency - continue OTC supplement, 1000ius daily  Cardiac murmur - was not auscultated at previous exam, pt states previous providers have told her she has a murmur but she has never had any imaging done - denies pain, palpitations, syncope, DOB, SOB - Will order an ECHO to rule out any cardiac structural changes. - may need cards referral based on results  Elevated BP w/o diag of HTN GOAL<120/8 Previously controlled - monitor at home, DASH diet - increase water intake and exercise F/u in 4 weeks for rechecl   Health Maintenance  Topic Date Due   Hepatitis B Vaccine (1 of 3 - 19+ 3-dose series) Never done   Breast Cancer Screening  Never done    Pneumococcal Vaccine (2 of 2 - PCV) 09/02/2018   Pap with HPV screening  07/06/2019   DTaP/Tdap/Td vaccine (2 - Td or Tdap) 03/12/2022   COVID-19 Vaccine (2 - 2025-26 season) 07/20/2024   Flu Shot  02/16/2025*   Cologuard (Stool DNA test)  01/30/2027   Hepatitis C Screening  Completed   HIV Screening  Completed   HPV Vaccine  Aged Out   Meningitis B Vaccine  Aged Out  *Topic was postponed. The date shown is not the original due date.    Discussed health benefits of physical activity, and encouraged her to engage in regular exercise appropriate for her age and condition.  There are no diagnoses linked to this encounter.   Return for annual physical.     Curtis DELENA Boom, FNP

## 2024-08-08 ENCOUNTER — Encounter: Payer: Self-pay | Admitting: Family Medicine

## 2024-08-11 ENCOUNTER — Telehealth: Admitting: Family

## 2024-08-11 DIAGNOSIS — J069 Acute upper respiratory infection, unspecified: Secondary | ICD-10-CM

## 2024-08-23 ENCOUNTER — Other Ambulatory Visit: Payer: Self-pay

## 2024-08-23 MED ORDER — FLUTICASONE PROPIONATE 50 MCG/ACT NA SUSP
2.0000 | Freq: Every day | NASAL | 6 refills | Status: AC
  Filled 2024-08-23: qty 16, 30d supply, fill #0
  Filled 2024-10-20: qty 16, 30d supply, fill #1

## 2024-08-23 MED ORDER — BENZONATATE 100 MG PO CAPS
100.0000 mg | ORAL_CAPSULE | Freq: Three times a day (TID) | ORAL | 0 refills | Status: AC | PRN
  Filled 2024-08-23: qty 20, 7d supply, fill #0

## 2024-08-23 NOTE — Progress Notes (Signed)
 We are sorry you are not feeling well.  Here is how we plan to help!  Based on what you have shared with me, it looks like you may have a viral upper respiratory infection.  Upper respiratory infections are caused by a large number of viruses; however, rhinovirus is the most common cause.   Symptoms vary from person to person, with common symptoms including sore throat, cough, and fatigue or lack of energy.  A low-grade fever of up to 100.4 may present, but is often uncommon.  Symptoms vary however, and are closely related to a person's age or underlying illnesses.  The most common symptoms associated with an upper respiratory infection are nasal discharge or congestion, cough, sneezing, headache and pressure in the ears and face.  These symptoms usually persist for about 3 to 10 days, but can last up to 2 weeks.  It is important to know that upper respiratory infections do not cause serious illness or complications in most cases.    Upper respiratory infections can be transmitted from person to person, with the most common method of transmission being a person's hands.  The virus is able to live on the skin and can infect other persons for up to 2 hours after direct contact.  Also, these can be transmitted when someone coughs or sneezes; thus, it is important to cover the mouth to reduce this risk.  To keep the spread of the illness at bay, good hand hygiene is very important.  This is an infection that is most likely caused by a virus. There are no specific treatments other than to help you with the symptoms until the infection runs its course.  We are sorry you are not feeling well.  Here is how we plan to help!   For nasal congestion, you may use an oral decongestants such as Mucinex  D or if you have glaucoma or high blood pressure use plain Mucinex .  Saline nasal spray or nasal drops can help and can safely be used as often as needed for congestion.  For your congestion, I have prescribed Fluticasone   nasal spray one spray in each nostril twice a day  If you do not have a history of heart disease, hypertension, diabetes or thyroid disease, prostate/bladder issues or glaucoma, you may also use Sudafed to treat nasal congestion.  It is highly recommended that you consult with a pharmacist or your primary care physician to ensure this medication is safe for you to take.     If you have a cough, you may use cough suppressants such as Delsym and Robitussin.  If you have glaucoma or high blood pressure, you can also use Coricidin HBP.   For cough I have prescribed for you A prescription cough medication called Tessalon  Perles 100 mg. You may take 1-2 capsules every 8 hours as needed for cough  If you have a sore or scratchy throat, use a saltwater gargle-  to  teaspoon of salt dissolved in a 4-ounce to 8-ounce glass of warm water.  Gargle the solution for approximately 15-30 seconds and then spit.  It is important not to swallow the solution.  You can also use throat lozenges/cough drops and Chloraseptic spray to help with throat pain or discomfort.  Warm or cold liquids can also be helpful in relieving throat pain.  For headache, pain or general discomfort, you can use Ibuprofen  or Tylenol  as directed.   Some authorities believe that zinc sprays or the use of Echinacea may shorten the  course of your symptoms.   HOME CARE Only take medications as instructed by your medical team. Be sure to drink plenty of fluids. Water is fine as well as fruit juices, sodas and electrolyte beverages. You may want to stay away from caffeine or alcohol. If you are nauseated, try taking small sips of liquids. How do you know if you are getting enough fluid? Your urine should be a pale yellow or almost colorless. Get rest. Taking a steamy shower or using a humidifier may help nasal congestion and ease sore throat pain. You can place a towel over your head and breathe in the steam from hot water coming from a  faucet. Using a saline nasal spray works much the same way. Cough drops, hard candies and sore throat lozenges may ease your cough. Avoid close contacts especially the very young and the elderly Cover your mouth if you cough or sneeze Always remember to wash your hands.   GET HELP RIGHT AWAY IF: You develop worsening fever. If your symptoms do not improve within 10 days You develop yellow or green discharge from your nose over 3 days. You have coughing fits You develop a severe head ache or visual changes. You develop shortness of breath, difficulty breathing or start having chest pain Your symptoms persist after you have completed your treatment plan  MAKE SURE YOU  Understand these instructions. Will watch your condition. Will get help right away if you are not doing well or get worse.  Your e-visit answers were reviewed by a board certified advanced clinical practitioner to complete your personal care plan. Depending upon the condition, your plan could have included both over the counter or prescription medications. Please review your pharmacy choice. If there is a problem, you may call our nursing hot line at and have the prescription routed to another pharmacy. Your safety is important to us . If you have drug allergies check your prescription carefully.   You can use MyChart to ask questions about today's visit, request a non-urgent call back, or ask for a work or school excuse for 24 hours related to this e-Visit. If it has been greater than 24 hours you will need to follow up with your provider, or enter a new e-Visit to address those concerns. You will get an e-mail in the next two days asking about your experience.  I hope that your e-visit has been valuable and will speed your recovery. Thank you for using e-visits.   I have spent 5 minutes in review of e-visit questionnaire, review and updating patient chart, medical decision making and response to patient.   Bari Learn,  FNP

## 2024-09-10 NOTE — Addendum Note (Signed)
 Addended by: LILIAN SEVERO RAMAN on: 09/10/2024 11:47 AM   Modules accepted: Orders

## 2024-09-11 ENCOUNTER — Ambulatory Visit (INDEPENDENT_AMBULATORY_CARE_PROVIDER_SITE_OTHER): Admitting: Family Medicine

## 2024-09-11 ENCOUNTER — Ambulatory Visit
Admission: RE | Admit: 2024-09-11 | Discharge: 2024-09-11 | Disposition: A | Discharge status: Home / Self Care | Source: Ambulatory Visit | Attending: Family Medicine | Admitting: Family Medicine

## 2024-09-11 ENCOUNTER — Encounter: Payer: Self-pay | Admitting: Family Medicine

## 2024-09-11 VITALS — BP 129/78 | HR 64 | Ht 61.0 in | Wt 139.8 lb

## 2024-09-11 DIAGNOSIS — Z1231 Encounter for screening mammogram for malignant neoplasm of breast: Secondary | ICD-10-CM | POA: Diagnosis not present

## 2024-09-11 DIAGNOSIS — F17210 Nicotine dependence, cigarettes, uncomplicated: Secondary | ICD-10-CM

## 2024-09-11 DIAGNOSIS — R030 Elevated blood-pressure reading, without diagnosis of hypertension: Secondary | ICD-10-CM

## 2024-09-11 NOTE — Progress Notes (Signed)
 Established Patient Office Visit  Introduced to nurse practitioner role and practice setting.  All questions answered.  Discussed provider/patient relationship and expectations.   Subjective   Patient ID: Gina Jennings, female    DOB: [birth year]  Age: 48 y.o. MRN: [id number]  Chief Complaint  Patient presents with   Follow-up    Blood Pressure    Discussed the use of AI scribe software for clinical note transcription with the patient, who gave verbal consent to proceed.  History of Present Illness Gina Jennings is a 48 year old female, presents for blood pressure management.  She has been monitoring her blood pressure at home using a wrist machine, with readings typically in the range of 110s to 120s systolic. However, her blood pressure tends to be higher when measured in the clinic, with a recent reading of 140 systolic.No vision changes, headaches, chest pain, or difficulty breathing.  Tobacco use: She continues to smoke two to three cigarettes in the afternoon after work, but does not smoke during the daytime. She has been checking her blood pressure after smoking to see if it affects her readings, but has not noticed any significant changes.  She recently completed a mammogram, which she reports went well and took less than ten minutes.        09/11/2024    3:04 PM 01/20/2024    3:34 PM  Depression screen PHQ 2/9  Decreased Interest 0 0  Down, Depressed, Hopeless 0 0  PHQ - 2 Score 0 0  Altered sleeping 2 3  Tired, decreased energy 1 1  Change in appetite 1 2  Feeling bad or failure about yourself  0 0  Trouble concentrating 0 0  Moving slowly or fidgety/restless 0 0  Suicidal thoughts 0 0  PHQ-9 Score 4 6  Difficult doing work/chores Not difficult at all Somewhat difficult       09/11/2024    3:04 PM 01/20/2024    3:34 PM  GAD 7 : Generalized Anxiety Score  Nervous, Anxious, on Edge 0 0  Control/stop worrying 0 0  Worry too much - different  things 0 0  Trouble relaxing 0 0  Restless 0 0  Easily annoyed or irritable 0 0  Afraid - awful might happen 0 0  Total GAD 7 Score 0 0  Anxiety Difficulty Not difficult at all Not difficult at all     ROS  Negative unless indicated in HPI   Objective:     BP 129/78 (BP Location: Left Arm, Patient Position: Sitting, Cuff Size: Normal) Comment: manual  Pulse 64   Ht 5' 1 (1.549 m)   Wt 139 lb 12.8 oz (63.4 kg)   LMP 08/23/2024 (Exact Date)   SpO2 100%   BMI 26.41 kg/m  BP Readings from Last 3 Encounters:  09/11/24 129/78  08/07/24 138/66  01/20/24 119/67   Wt Readings from Last 3 Encounters:  09/11/24 139 lb 12.8 oz (63.4 kg)  08/07/24 136 lb 3.2 oz (61.8 kg)  01/20/24 129 lb (58.5 kg)      Physical Exam Constitutional:      General: She is not in acute distress.    Appearance: Normal appearance. She is normal weight. She is not ill-appearing, toxic-appearing or diaphoretic.  HENT:     Head: Normocephalic.     Nose: Nose normal.     Mouth/Throat:     Mouth: Mucous membranes are moist.     Pharynx: Oropharynx is clear.  Eyes:  Extraocular Movements: Extraocular movements intact.     Pupils: Pupils are equal, round, and reactive to light.  Cardiovascular:     Rate and Rhythm: Normal rate and regular rhythm.     Pulses: Normal pulses.     Heart sounds: Normal heart sounds. No murmur heard.    No friction rub. No gallop.  Pulmonary:     Effort: No respiratory distress.     Breath sounds: No stridor. No wheezing, rhonchi or rales.  Chest:     Chest wall: No tenderness.  Musculoskeletal:     Right lower leg: No edema.     Left lower leg: No edema.  Skin:    General: Skin is warm and dry.     Capillary Refill: Capillary refill takes less than 2 seconds.  Neurological:     General: No focal deficit present.     Mental Status: She is alert and oriented to person, place, and time. Mental status is at baseline.  Psychiatric:        Mood and Affect: Mood  normal.        Behavior: Behavior normal.        Thought Content: Thought content normal.        Judgment: Judgment normal.      No results found for any visits on 09/11/24.    The 10-year ASCVD risk score (Arnett DK, et al., 2019) is: 3.2%    Assessment & Plan:  Elevated blood-pressure reading without diagnosis of hypertension  Tobacco dependence due to cigarettes     Assessment and Plan Assessment & Plan Elevated Blood Pressure w/o HX of HTN Blood pressure is well-controlled at home with readings ranging from 110s to 120s systolic. DBP 60s/70s. In-clinic readings were initially elevated, but manual recheck showed 129/78 mmHg. No symptoms such as vision changes, headaches, chest pain, or dyspnea. Anxiety during clinic visits may influence readings. Smoking may contribute to chronic inflammation affecting blood pressure - stressed cessation. GOAL<120/80 - okay to continue lifestyle management - discussed if home reading are 130 of higher follow up sooner  - Monitor blood pressure at home regularly - x3 weekly - DASH diet, regular weekly purposeful movement, 20 minute walks F/u 6 months BP and labs  Tobacco use Continues to smoke 2-3 cigarettes in the afternoon, none during the daytime. Smoking cessation is important due to its contribution to chronic inflammation and potential impact on blood pressure. - Continue efforts to reduce smoking  General Health Maintenance Completed mammogram screening today    Return in about 6 months (around 03/12/2025).   I, Curtis DELENA Boom, FNP, have reviewed all documentation for this visit. The documentation on 09/11/24 for the exam, diagnosis, procedures, and orders are all accurate and complete.   Curtis DELENA Boom, FNP

## 2024-09-14 NOTE — Addendum Note (Signed)
 Addended by: LILIAN SEVERO RAMAN on: 09/14/2024 10:21 AM   Modules accepted: Orders

## 2024-09-16 ENCOUNTER — Ambulatory Visit: Payer: Self-pay | Admitting: Family Medicine

## 2025-03-12 ENCOUNTER — Encounter
